# Patient Record
Sex: Female | Born: 1975 | Race: Black or African American | Hispanic: No | Marital: Married | Smoking: Never smoker
Health system: Southern US, Community
[De-identification: ages and names within clinical notes are randomized; demographics above are authoritative.]

## PROBLEM LIST (undated history)

## (undated) DIAGNOSIS — B977 Papillomavirus as the cause of diseases classified elsewhere: Secondary | ICD-10-CM

## (undated) DIAGNOSIS — I10 Essential (primary) hypertension: Secondary | ICD-10-CM

## (undated) DIAGNOSIS — E282 Polycystic ovarian syndrome: Secondary | ICD-10-CM

## (undated) DIAGNOSIS — L680 Hirsutism: Secondary | ICD-10-CM

## (undated) DIAGNOSIS — G43909 Migraine, unspecified, not intractable, without status migrainosus: Secondary | ICD-10-CM

---

## 2001-12-18 ENCOUNTER — Emergency Department (HOSPITAL_COMMUNITY): Admission: EM | Admit: 2001-12-18 | Discharge: 2001-12-18 | Payer: Self-pay | Admitting: *Deleted

## 2001-12-18 ENCOUNTER — Encounter: Payer: Self-pay | Admitting: *Deleted

## 2002-11-18 ENCOUNTER — Other Ambulatory Visit: Admission: RE | Admit: 2002-11-18 | Discharge: 2002-11-18 | Payer: Self-pay | Admitting: Obstetrics and Gynecology

## 2003-03-06 ENCOUNTER — Other Ambulatory Visit: Admission: RE | Admit: 2003-03-06 | Discharge: 2003-03-06 | Payer: Self-pay | Admitting: Obstetrics and Gynecology

## 2003-07-28 ENCOUNTER — Encounter: Admission: RE | Admit: 2003-07-28 | Discharge: 2003-10-26 | Payer: Self-pay | Admitting: Obstetrics and Gynecology

## 2003-08-12 ENCOUNTER — Inpatient Hospital Stay (HOSPITAL_COMMUNITY): Admission: AD | Admit: 2003-08-12 | Discharge: 2003-09-13 | Payer: Self-pay | Admitting: Obstetrics & Gynecology

## 2003-09-10 ENCOUNTER — Encounter (INDEPENDENT_AMBULATORY_CARE_PROVIDER_SITE_OTHER): Payer: Self-pay | Admitting: *Deleted

## 2003-09-14 ENCOUNTER — Encounter: Admission: RE | Admit: 2003-09-14 | Discharge: 2003-10-14 | Payer: Self-pay | Admitting: Obstetrics and Gynecology

## 2004-05-07 ENCOUNTER — Emergency Department (HOSPITAL_COMMUNITY): Admission: EM | Admit: 2004-05-07 | Discharge: 2004-05-08 | Payer: Self-pay | Admitting: Emergency Medicine

## 2005-05-31 IMAGING — US US FETAL BPP W/O NONSTRESS
1 series · 13 of 15 positions shown · non-contrast
Comparison: none

CLINICAL DATA: 27-year-old G1 P0 with decelerations on monitoring.

[Series 1: unknown · 0.30mm/px · 13 of 15 slices shown]
[im 1/15]
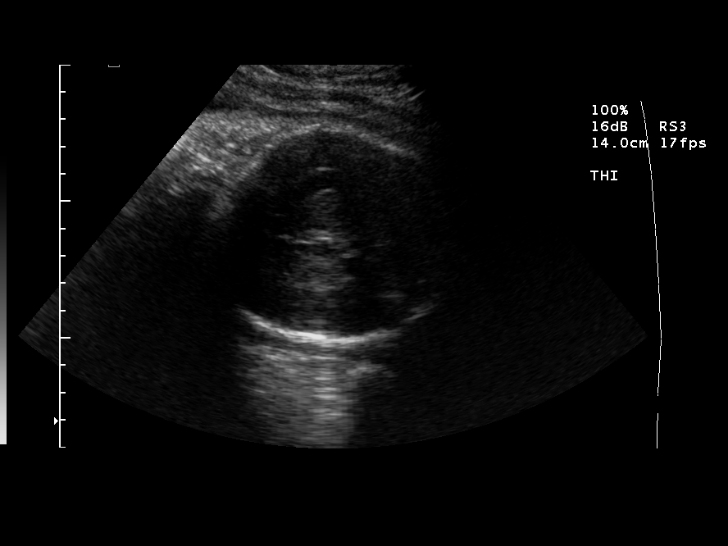
[im 2/15]
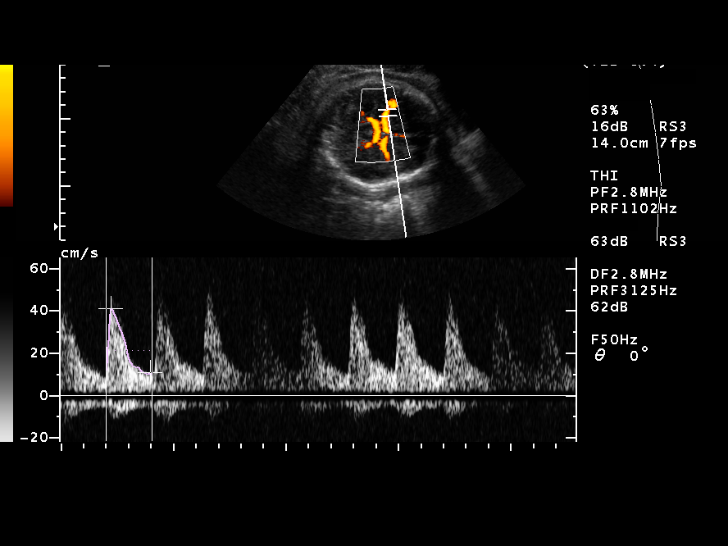
[im 3/15]
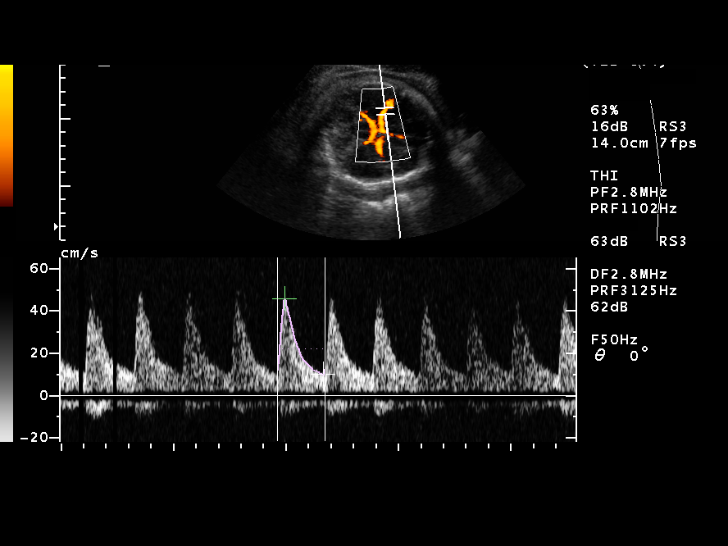
[im 5/15]
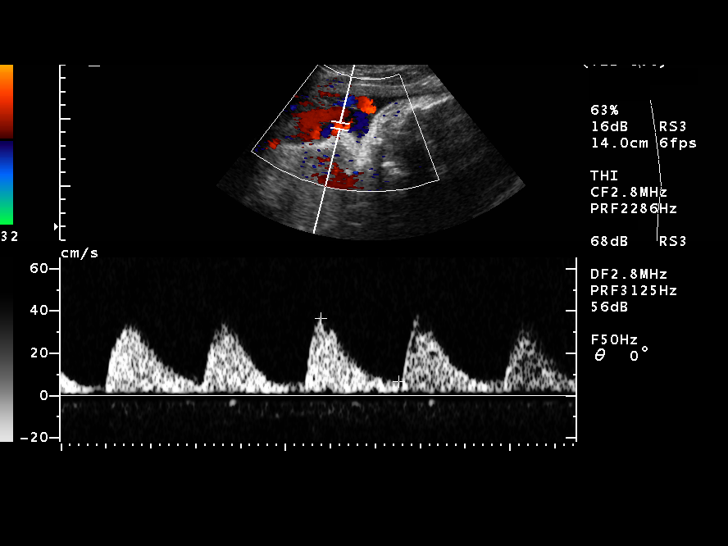
[im 6/15]
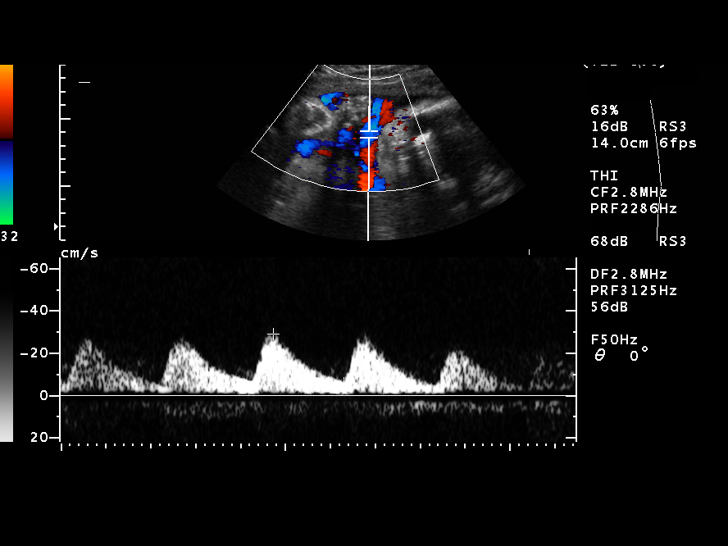
[im 7/15]
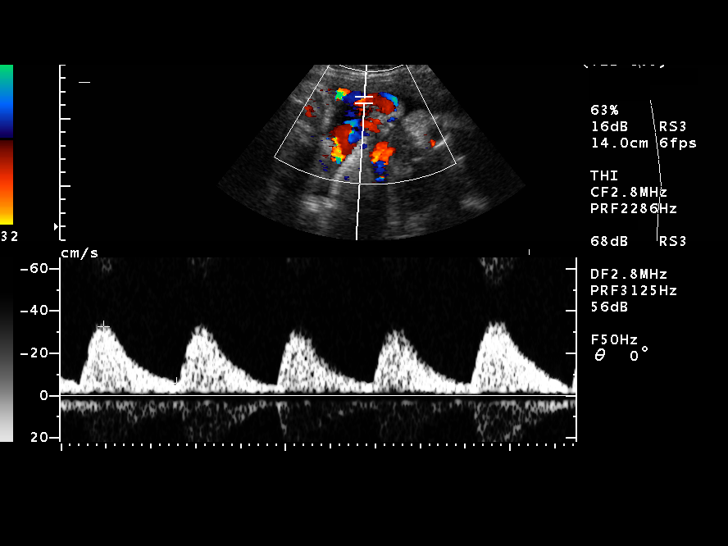
[im 8/15]
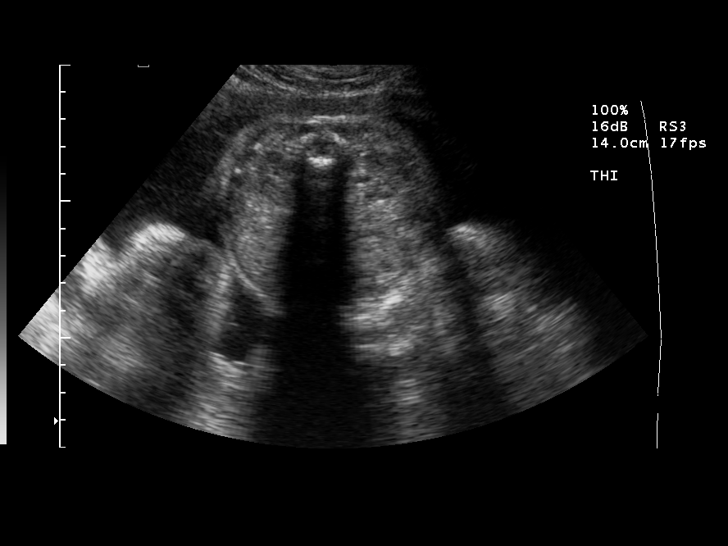
[im 9/15]
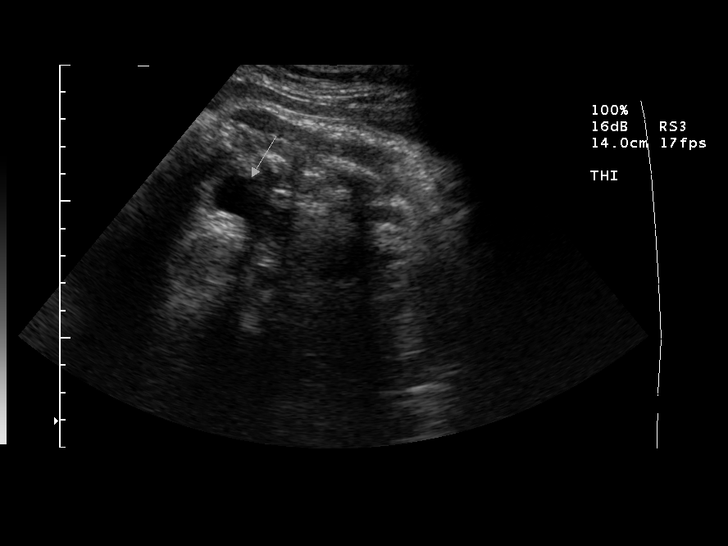
[im 10/15]
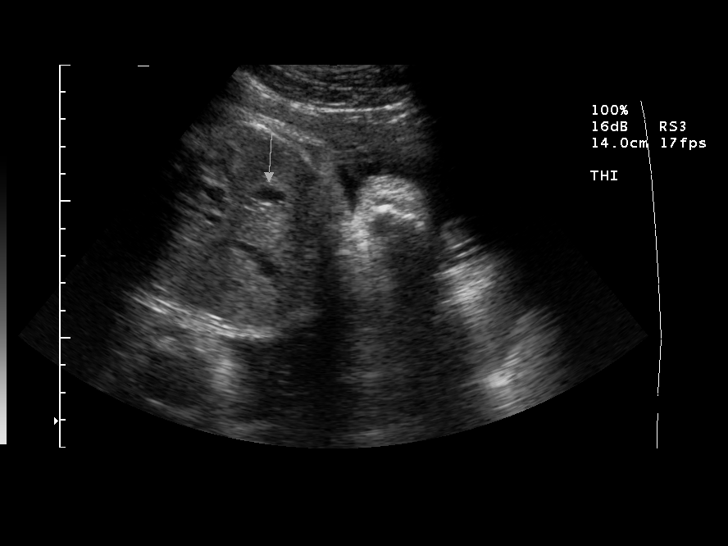
[im 11/15]
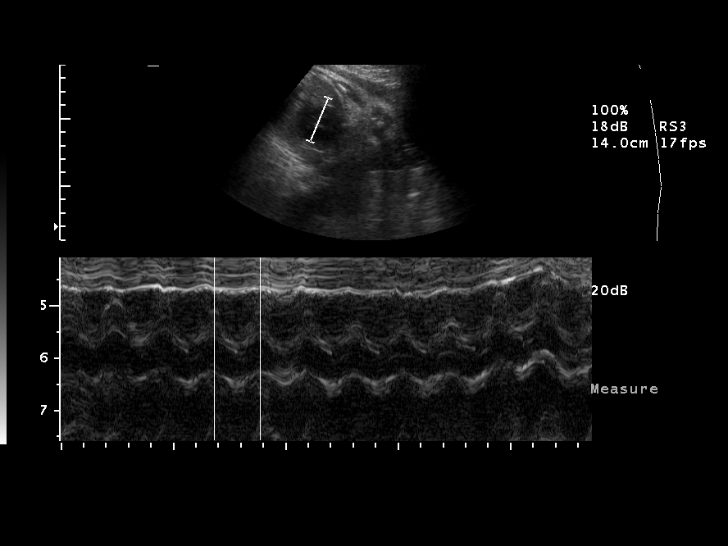
[im 13/15]
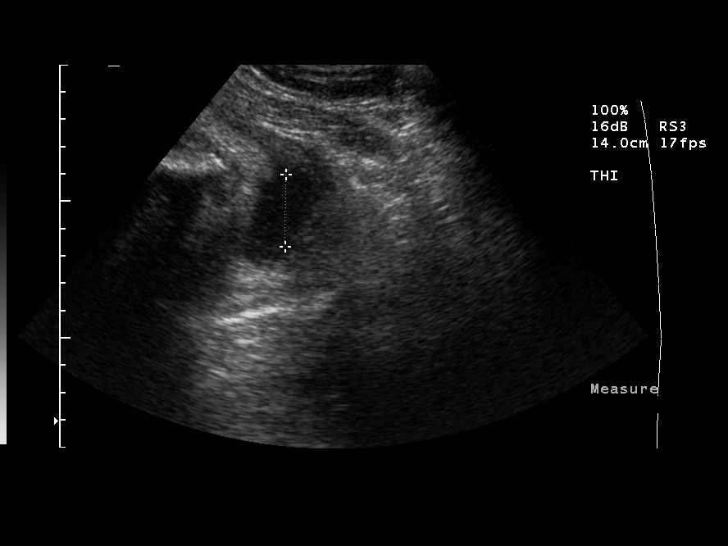
[im 14/15]
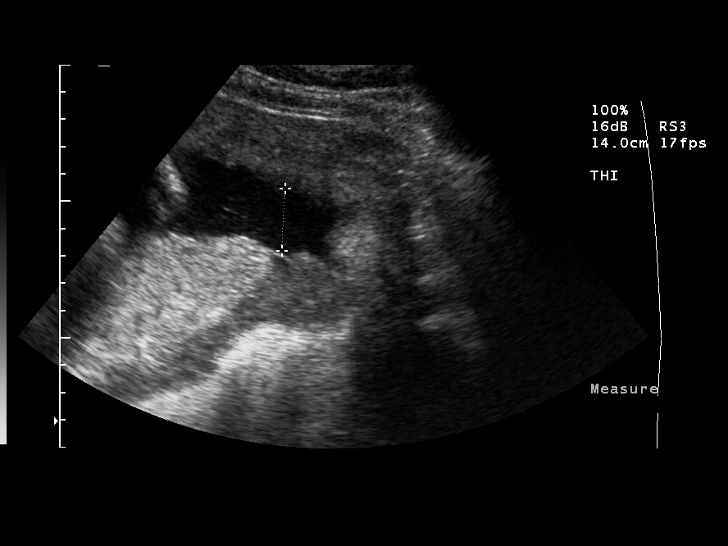
[im 15/15]
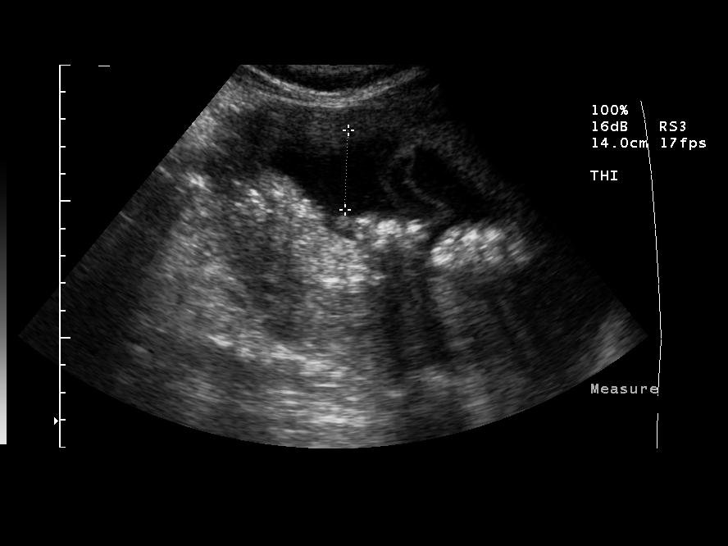

[13 of 15 positions shown; findings below may reference images not displayed]

BIOPHYSICAL PROFILE

 Number of Fetuses:  1
 Heart rate:  147 BPM
 Movement:  Yes
 Breathing:  Yes
 Presentation:  Cephalic
 Placental Location:  Fundal, posterior
 Grade:  I
 Previa:  No
 Amniotic Fluid (Subjective):  Normal
 Amniotic Fluid (Objective):  AFI 10.5 cm (2th-02th %ile =  8.3 to 24.5 cm for 33 weeks) 

 Fetal measurements and complete anatomic evaluation were not requested.  The following fetal anatomy was visualized on this exam:  stomach, kidneys, and bladder.
 By assigned gestational age, patient is 33 weeks 3 days.

 BPP SCORING
 Movements:  2  Time: 20 minutes
 Breathing:  2
 Tone:  2
 Amniotic Fluid:  2
 Total Score:  8

 DOPPLER ULTRASOUND OF FETUS

 Umbilical Artery S/D Ratio:  5.36 (NL< 3.70)

 Middle Cerebral Artery PI:  1.47 (NL> 1.46)
 MATERNAL FINDINGS:
 Cervix:  Not evaluated; on 08/28/03, measured 3.9 cm translabially.
IMPRESSION: Single living intrauterine fetus in cephalic presentation.  Biophysical profile is 8 of 8. the umbilical artery S/D ratio has increased since prior study on [DATE] and continues to be abnormal.  The middle cerebral artery pulsatility index is at the lower range of normal on today?s study.

## 2005-06-04 IMAGING — US US FETAL BPP W/O NONSTRESS
1 series · 18 of 28 positions shown · non-contrast
Comparison: none

CLINICAL DATA: G1 P0.  IUGR.  Assess fetal well-being and obstetrical doppler.

[Series 1: us fetal bpp w/o nonstress · non-contrast · 18 of 32 slices shown]
[im 1/32]
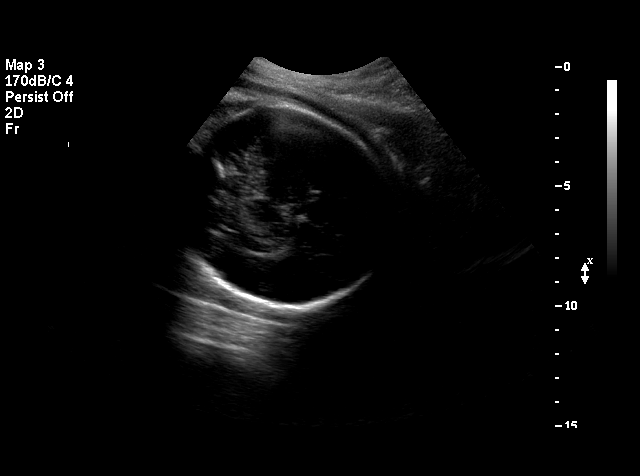
[im 3/32]
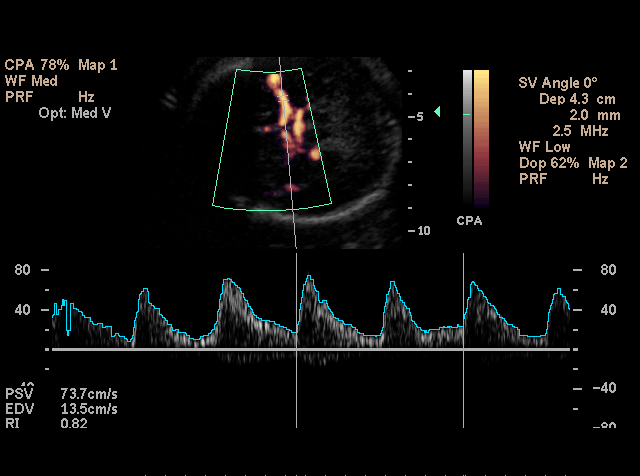
[im 4/32]
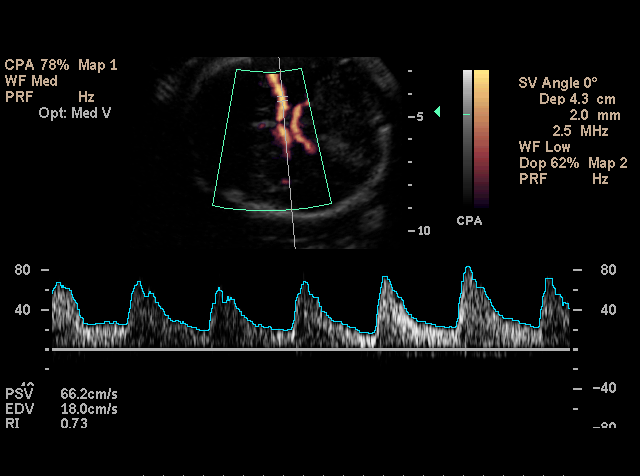
[im 6/32]
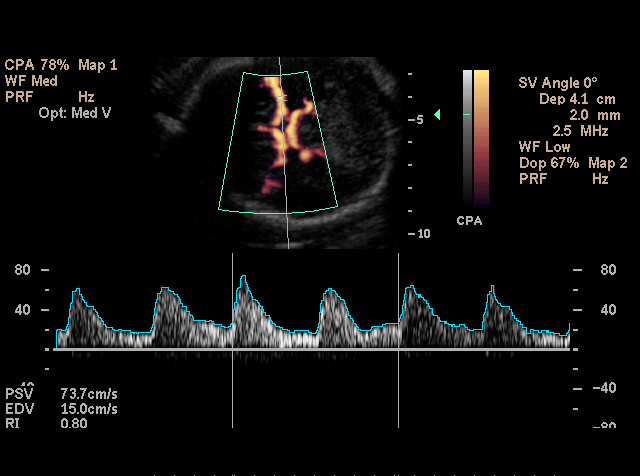
[im 9/32]
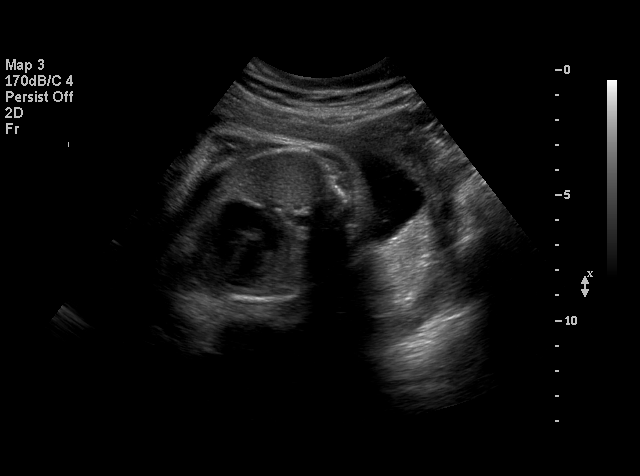
[im 10/32]
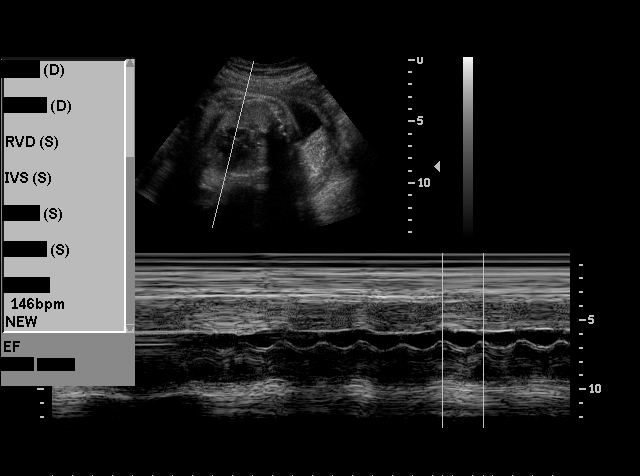
[im 12/32]
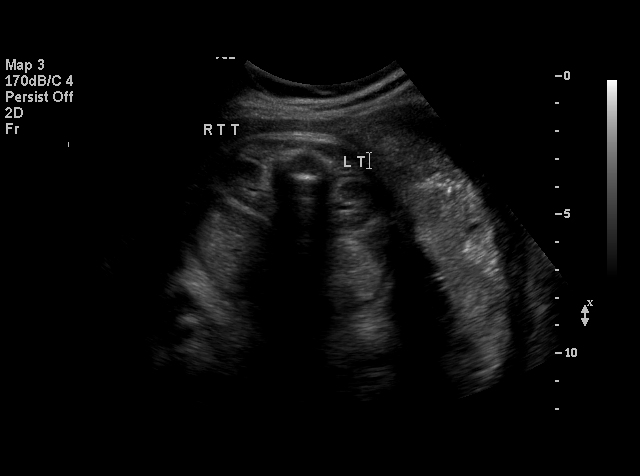
[im 13/32]
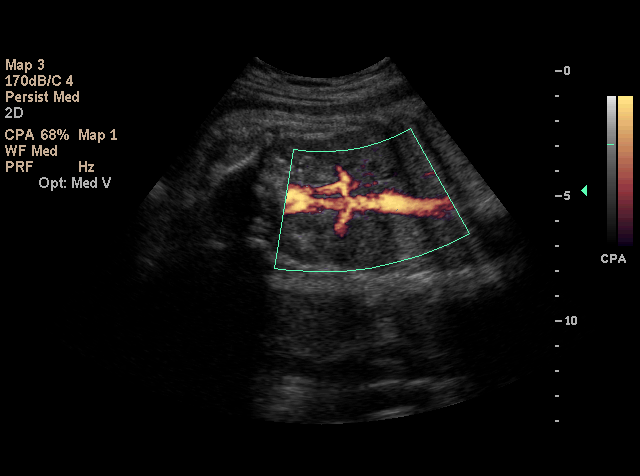
[im 15/32]
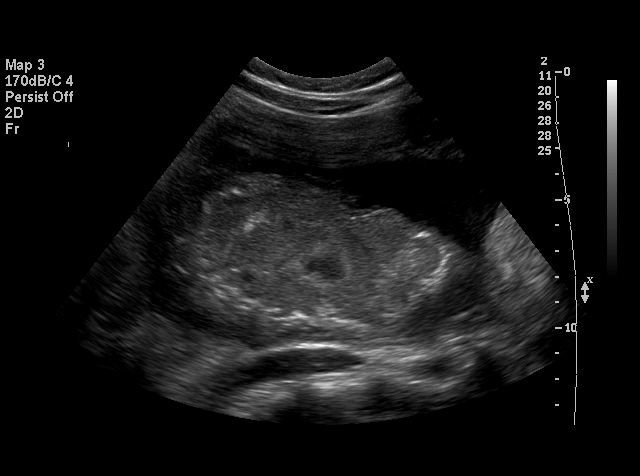
[im 17/32]
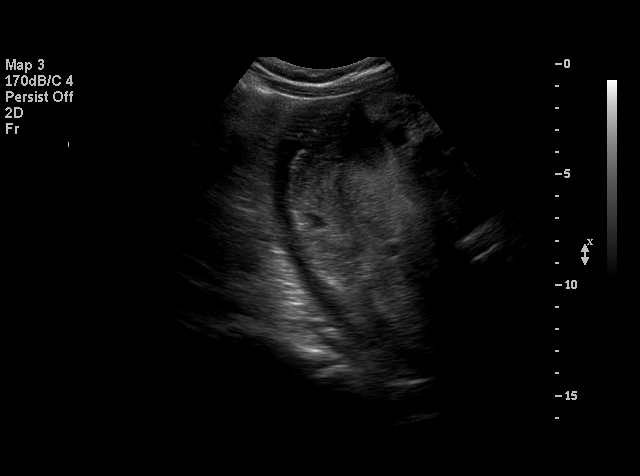
[im 19/32]
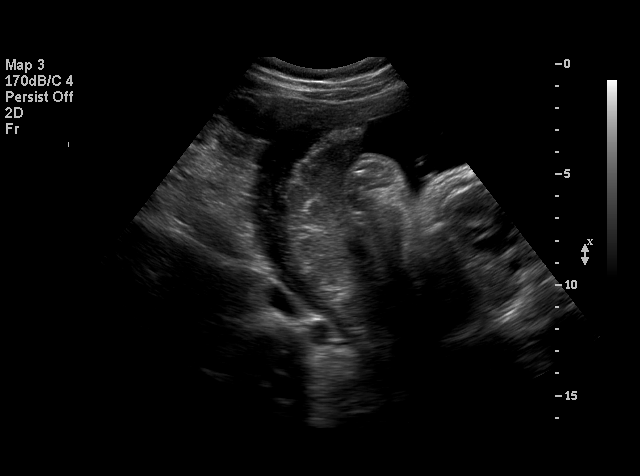
[im 20/32]
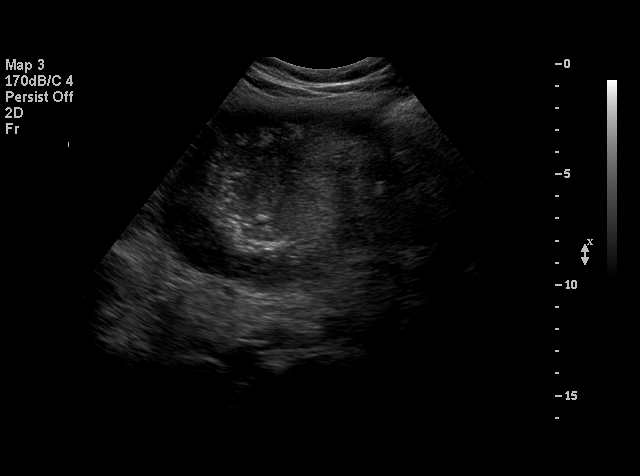
[im 22/32]
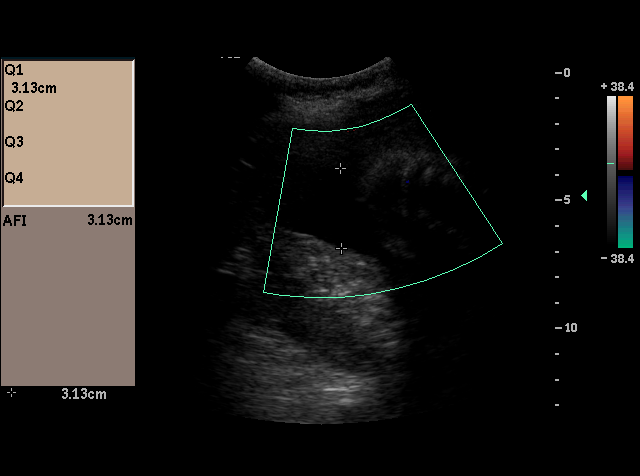
[im 25/32]
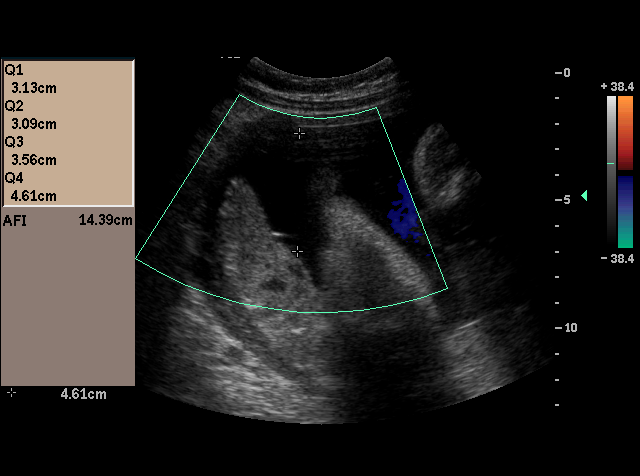
[im 26/32]
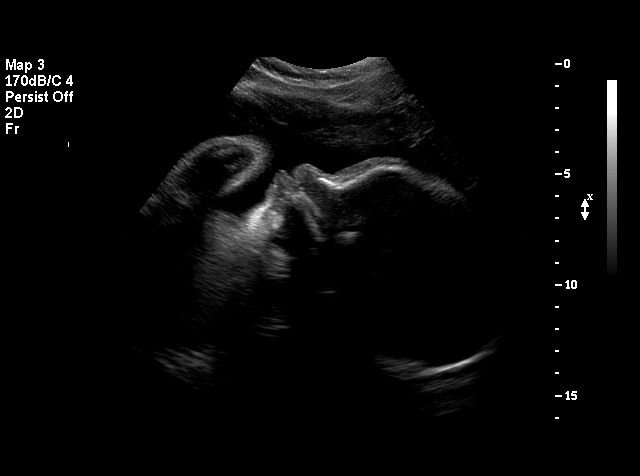
[im 28/32]
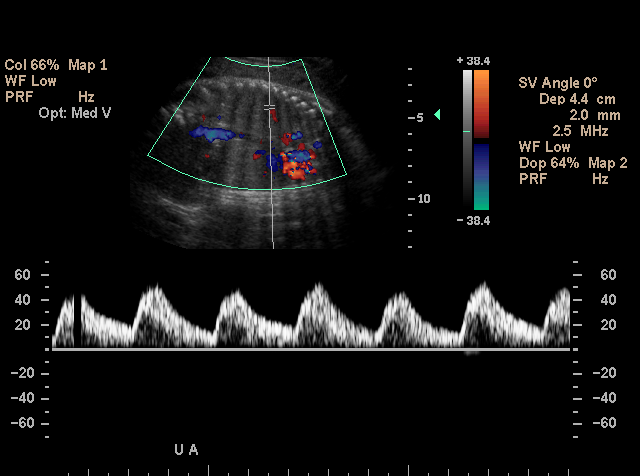
[im 29/32]
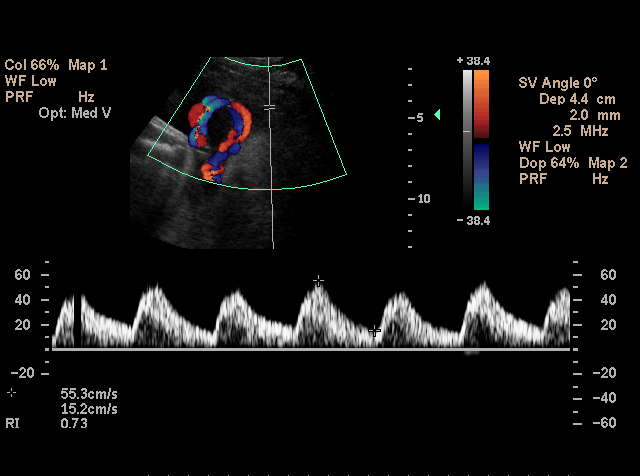
[im 32/32]
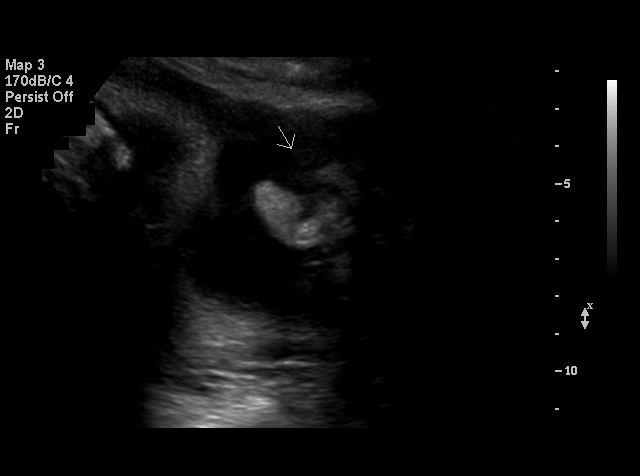

[18 of 28 positions shown; findings below may reference images not displayed]

BIOPHYSICAL PROFILE

 Number of Fetuses:  1
 Heart rate:  146
 Movement:  Yes
 Breathing:  Yes
 Presentation:  Cephalic 
 Placental Location:  Fundal, posterior, and right lateral
 Grade:  II
 Previa:  No
 Amniotic Fluid (Subjective):  Normal
 Amniotic Fluid (Objective):  14.4 cm AFI (5th -95th%ile =   8.1 ? 24.8 cm for 34 wks)

 Fetal measurements and complete anatomic evaluation were not requested.  The following fetal anatomy was visualized on this exam:  Lateral ventricles, thalami, stomach, kidneys, bladder and diaphragm

 BPP SCORING
 Movements:  2  Time:  15 minutes
 Breathing:  2
 Tone:  2
 Amniotic Fluid:  2
 Total Score:  8

 MATERNAL FINDINGS:
 Cervix:  Not evaluated
IMPRESSION: Single living intrauterine fetus in cephalic presentation with biophysical profile score [DATE] in 15 minutes of scanning.
 Normal amniotic fluid volume with AFI 14.4 cm.
 DOPPLER ULTRASOUND OF FETUS

 Umbilical Artery S/D Ratio: 3.73    (NL< 3.57)

 Middle Cerebral Artery PI: 1.57    (NL> 1.35)
IMPRESSION: Slightly elevated umbilical artery S/D ratio with no absent or reversed end diastolic flow.
 Normal middle cerebral pulsatility index.

## 2005-06-05 IMAGING — US US FETAL BPP W/O NONSTRESS
1 series · 13 of 20 positions shown · non-contrast
Comparison: none

CLINICAL DATA: Intrauterine growth restriction.  Evaluate fetal well-being and fetal dopplers.

[Series 1: unknown · 0.32mm/px · 13 of 20 slices shown]
[im 1/20]
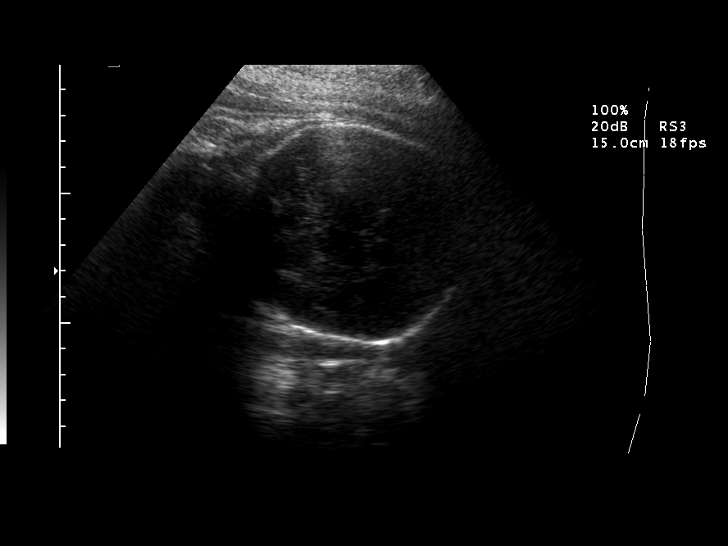
[im 3/20]
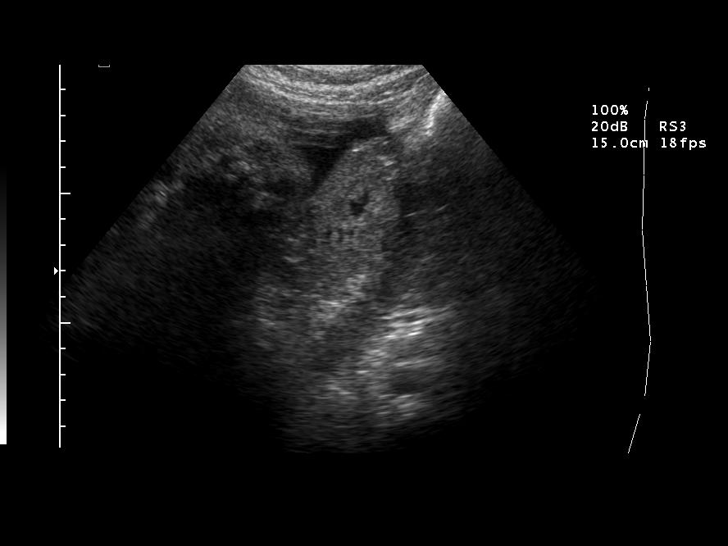
[im 4/20]
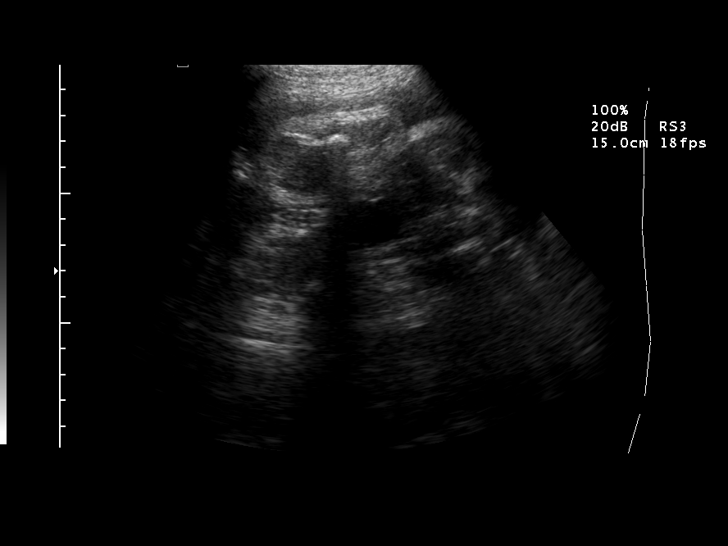
[im 6/20]
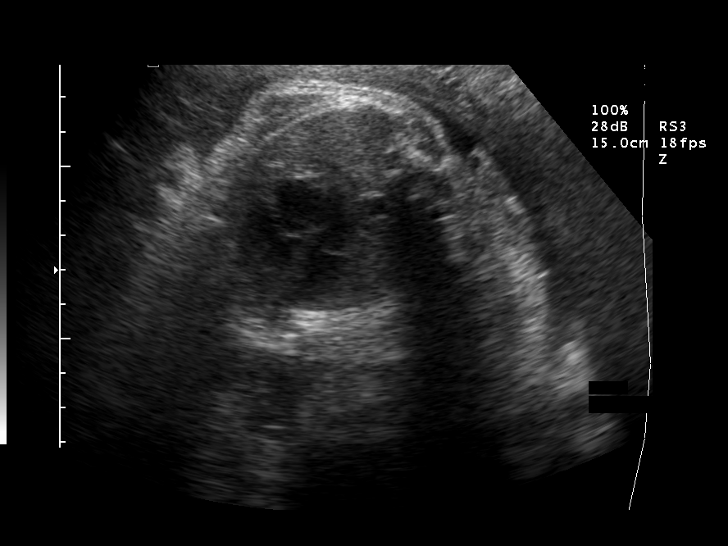
[im 7/20]
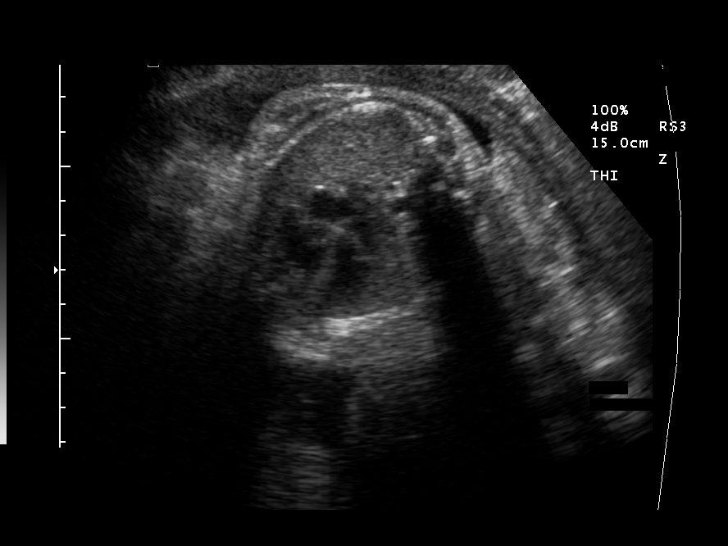
[im 9/20]
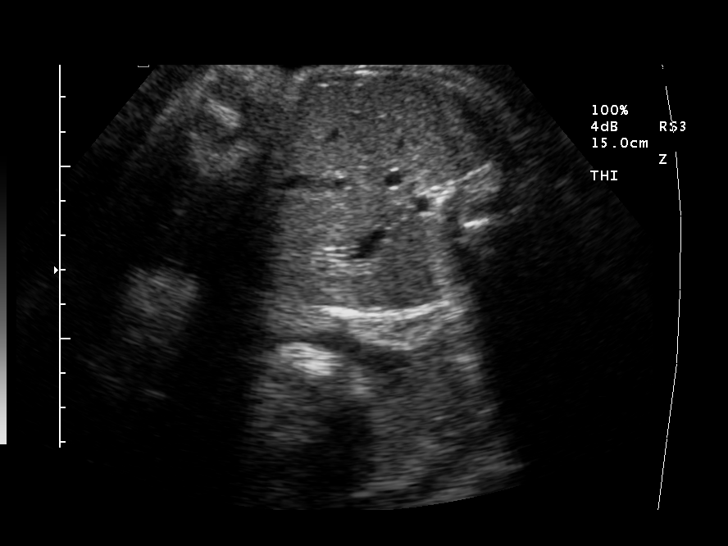
[im 11/20]
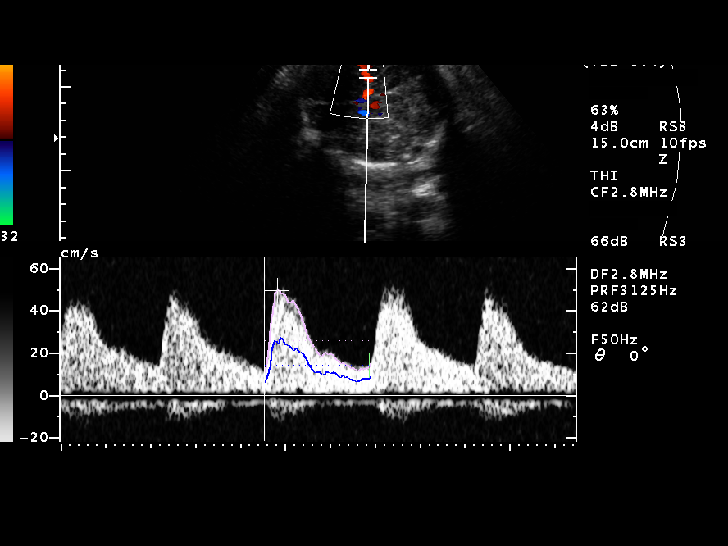
[im 12/20]
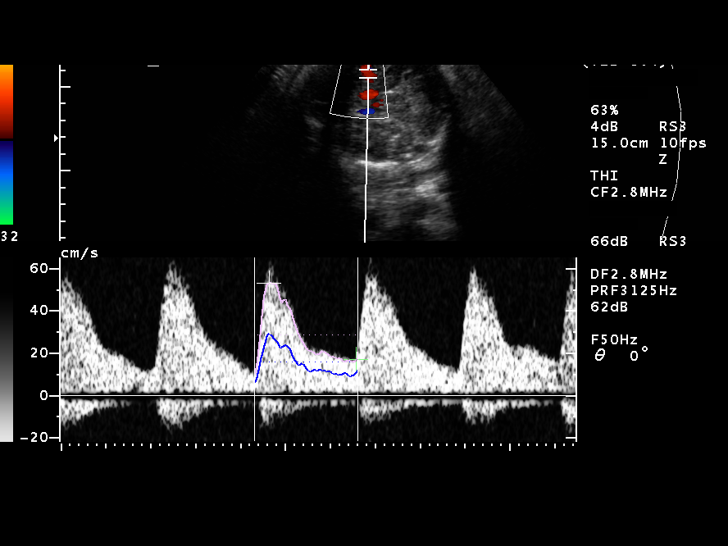
[im 14/20]
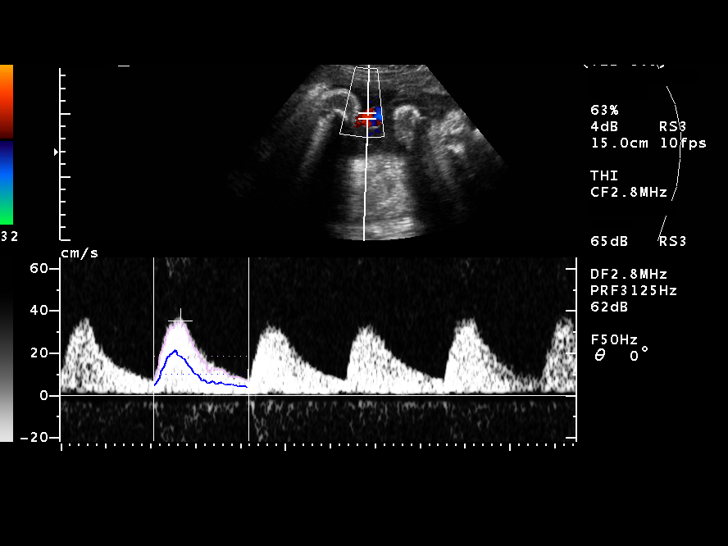
[im 15/20]
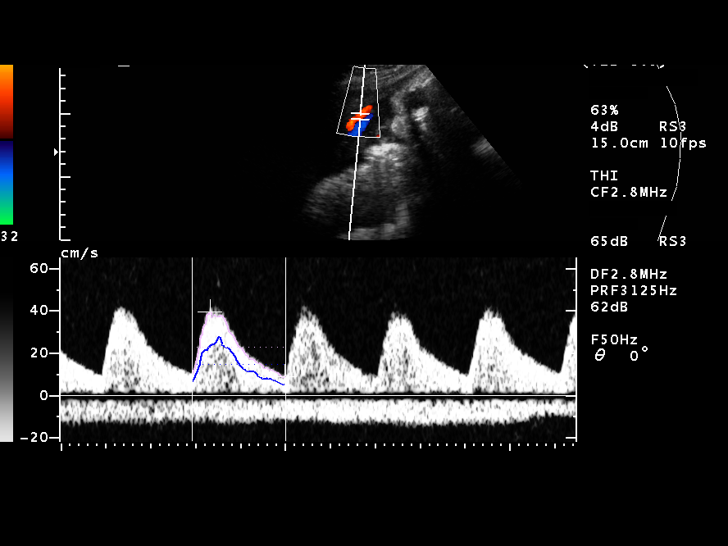
[im 17/20]
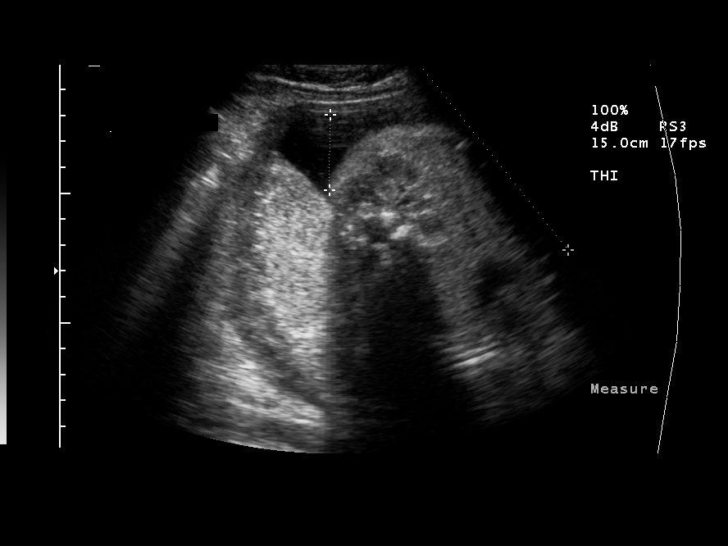
[im 18/20]
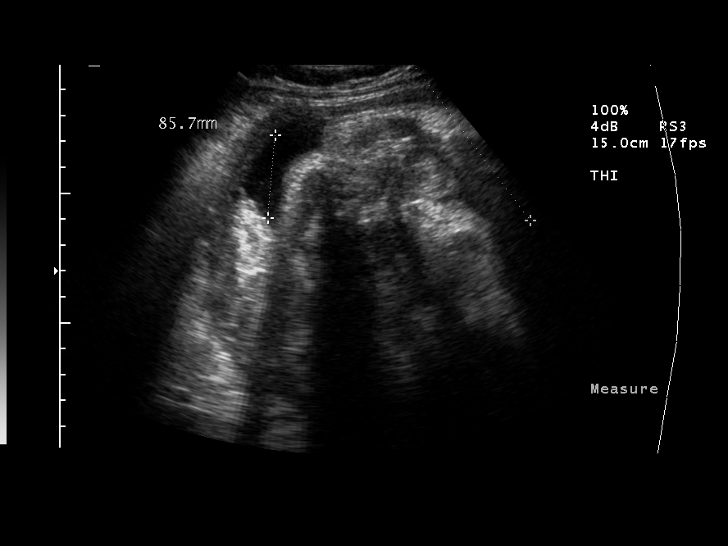
[im 20/20]
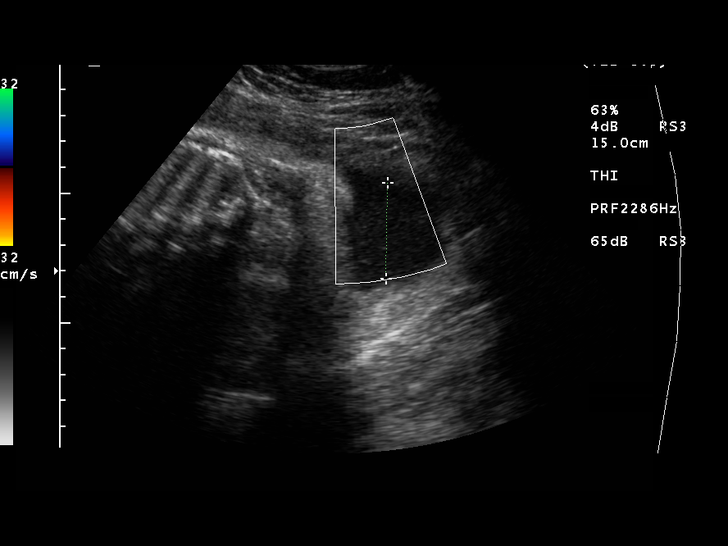

[13 of 20 positions shown; findings below may reference images not displayed]

BIOPHYSICAL PROFILE

Number of Fetuses:  1
Heart rate:  147
Movement:  Yes
Breathing:  Yes
Presentation:  Cephalic 
Placental Location:  Fundal
Grade:  II
Previa:  No
Amniotic Fluid (Subjective):  Normal
Amniotic Fluid (Objective):  11.6 cm AFI (5th -95th%ile = 8.1 ? 24.8 cm for 34 wks)

Fetal measurements and complete anatomic evaluation were not requested.  The following fetal anatomy was visualized on this exam:  Lateral ventricles, four chamber heart, stomach, kidneys, and bladder.

BPP SCORING
Movements:  2  Time:  10 minutes
Breathing:  2
Tone:  2
Amniotic Fluid:  2
Total Score:  8

MATERNAL FINDINGS:
Cervix:  Not evaluated
DOPPLER ULTRASOUND OF FETUS

Umbilical Artery S/D Ratio: 4.82   (NL< 3.57)

Middle Cerebral Artery PI: 1.35    (NL> 1.35)
IMPRESSION: Biophysical profile score 8 of 8.
Subjectively and quantitatively normal amniotic fluid volume.
Elevated umbilical artery doppler with an umbilical artery systolic-to-diastolic ratio today measuring 4.82 with a normal of less than 3.57 expected at 34 weeks estimated gestational age.  Middle cerebral artery doppler assessment is at the borderline measuring 1.35 with a normal of greater than 1.35 expected.  No absence or reversal of end diastolic flow is noted on any of the interrogated strips.  
The cervix was not evaluated today.

## 2005-06-07 IMAGING — US US FETAL BPP W/O NONSTRESS
1 series · 13 of 28 positions shown · non-contrast
Comparison: none

CLINICAL DATA: IUGR.  G1 P0.  Assess fetal well-being and obstetrical doppler.

[Series 1: unknown · 0.30mm/px · 13 of 30 slices shown]
[im 2/30]
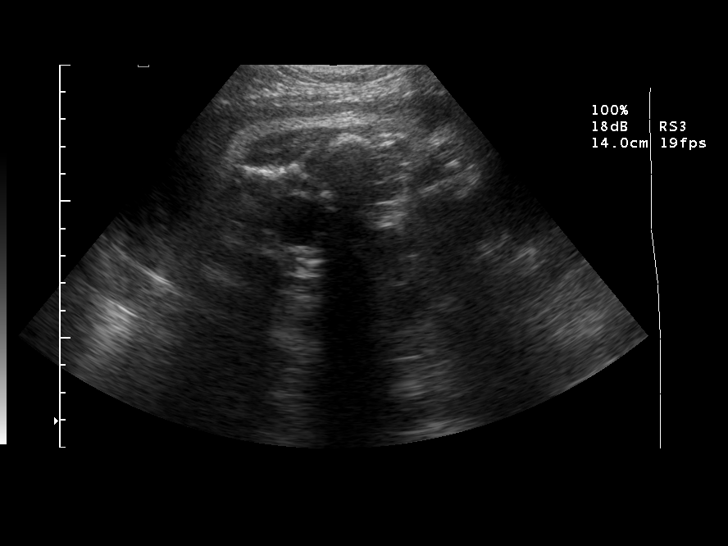
[im 4/30]
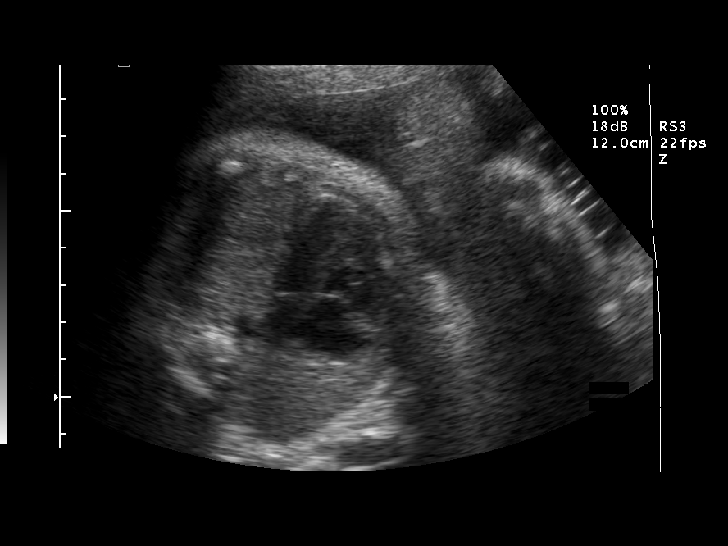
[im 6/30]
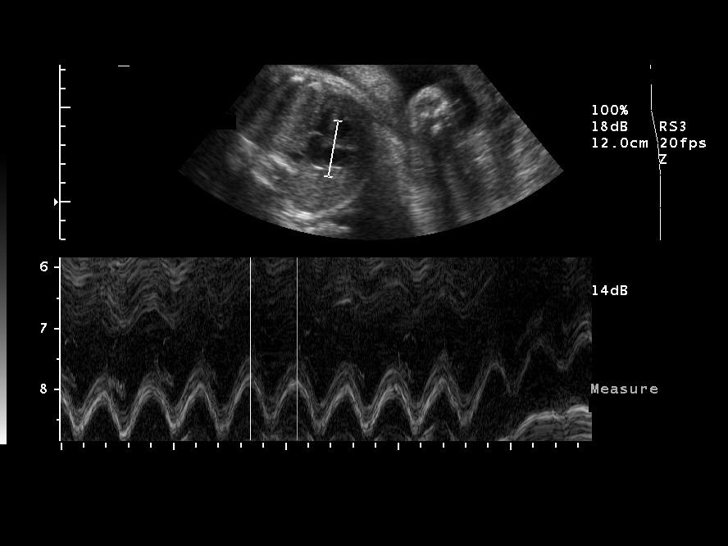
[im 8/30]
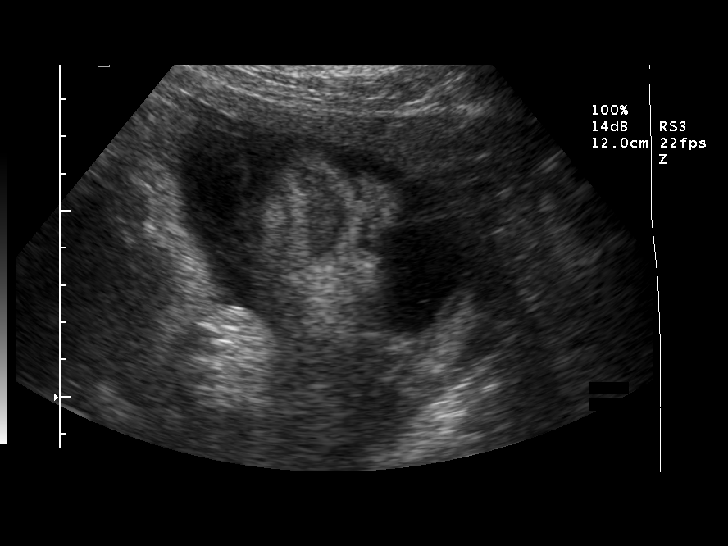
[im 10/30]
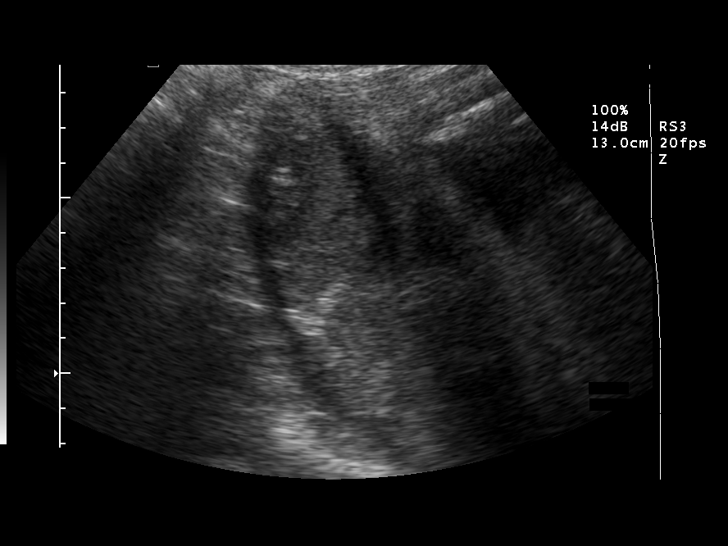
[im 12/30]
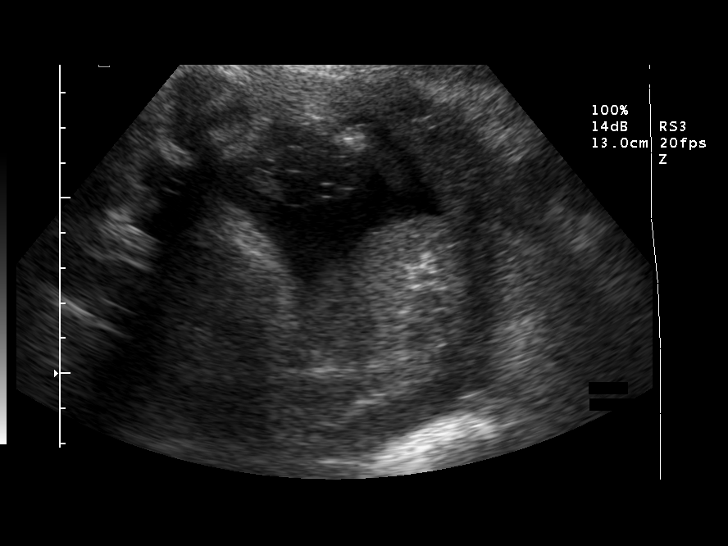
[im 16/30]
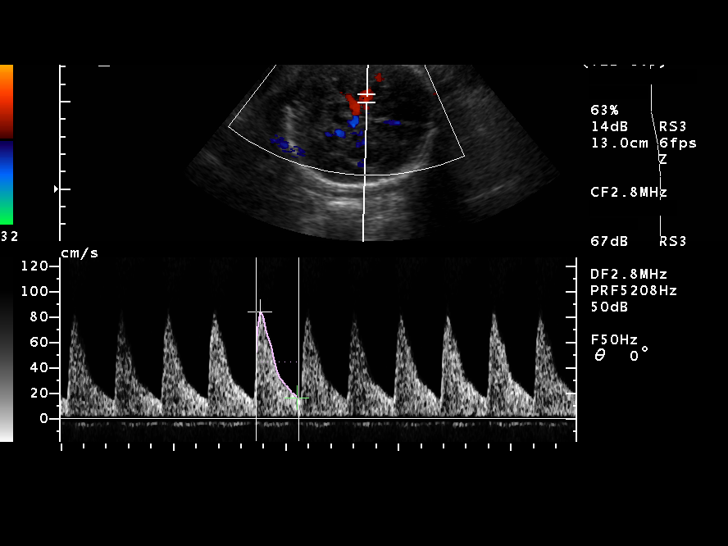
[im 18/30]
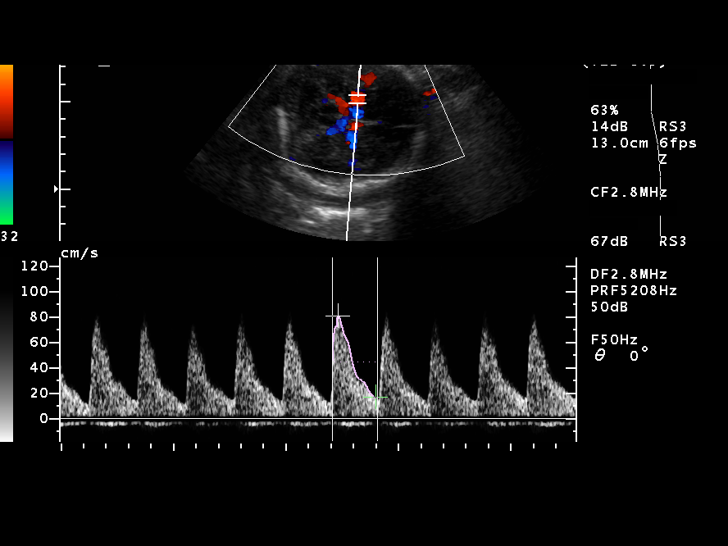
[im 20/30]
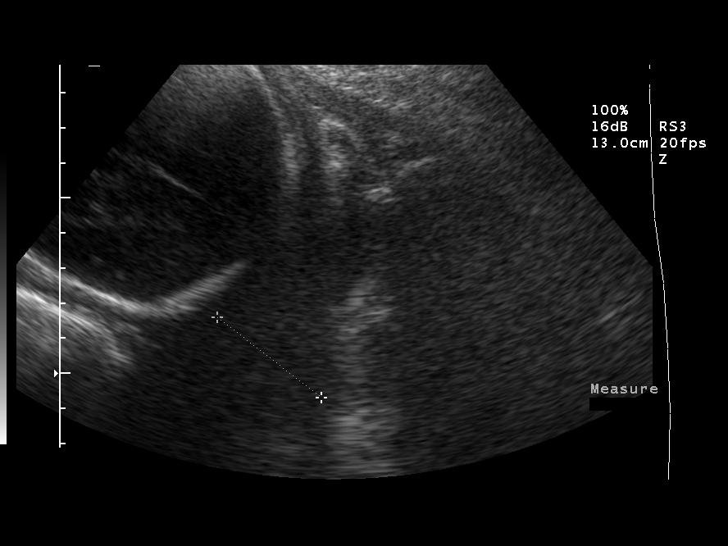
[im 22/30]
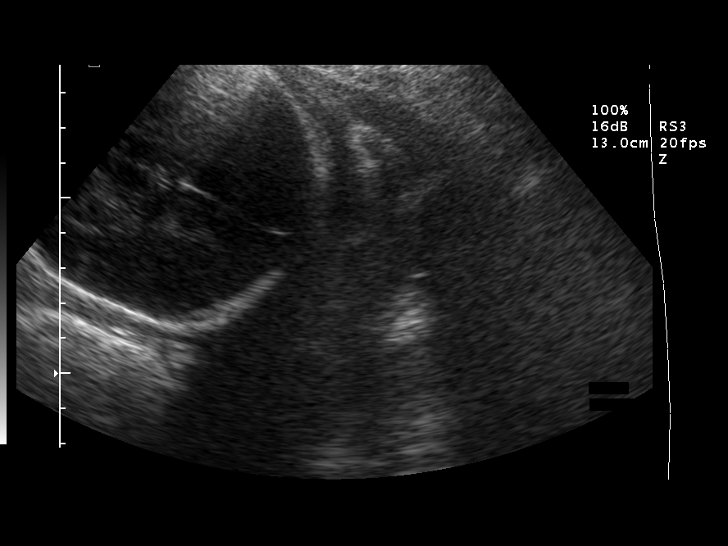
[im 24/30]
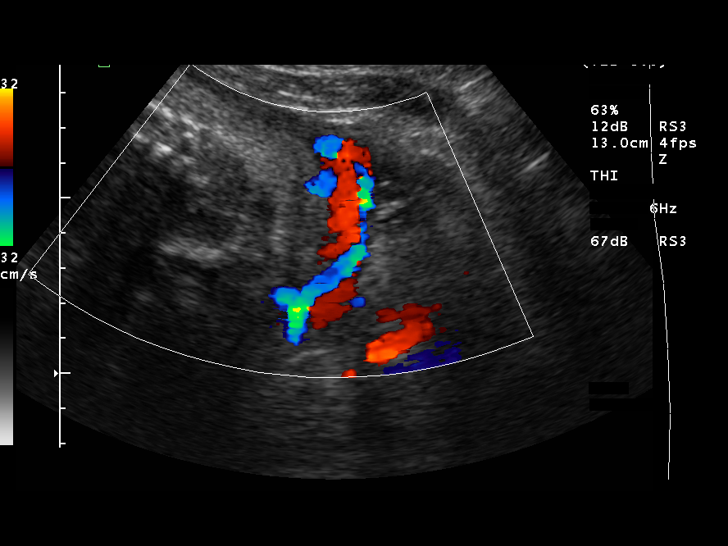
[im 26/30]
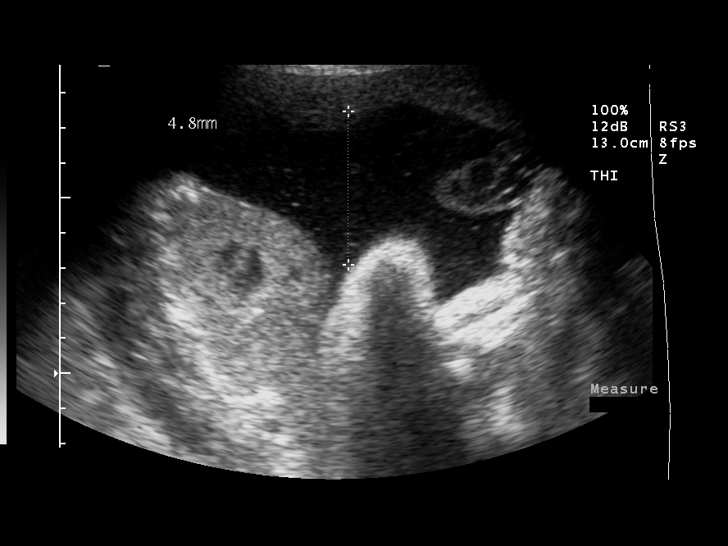
[im 28/30]
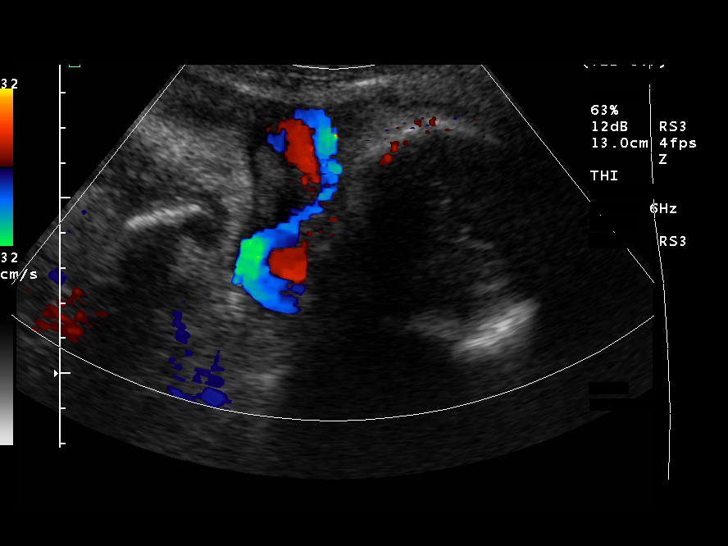

[13 of 28 positions shown; findings below may reference images not displayed]

BIOPHYSICAL PROFILE
 Comparison 09/04/03
 Number of Fetuses:  1
 Heart rate:  145
 Movement:  Yes
 Breathing:  Yes
 Presentation:  Cephalic
 Placental Location:  Fundal, posterior
 Grade:  II
 Previa:  No
 Amniotic Fluid (Subjective):  Normal
 Amniotic Fluid (Objective):  16 cm AFI (5th -95th%ile =   8.1 ? 24.9 cm for 34 wks)

 Fetal measurements and complete anatomic evaluation were not requested.  The following fetal anatomy was visualized on this exam:  Lateral ventricles, four chamber heart, stomach, kidneys, bladder, and upper lip.

 BPP SCORING
 Movements:  2  Time:  5 minutes
 Breathing:  2
 Tone:  2
 Amniotic Fluid:  2  
 Total Score:  8

 MATERNAL FINDINGS:
 Cervix:  3.8 cm Transabdominally
 DOPPLER ULTRASOUND OF FETUS

 Umbilical Artery S/D Ratio: 3.55    (NL< 3.57)

 Middle Cerebral Artery PI: 1.43    (NL> 1.35)
IMPRESSION: Single living intrauterine gestation in cephalic presentation with normal amniotic fluid volume.  
 Fetal anatomy as described.  Suggestion of nuchal cord.  Nursing notified of these results on 09/05/03.
 Biophysical profile score [DATE] after 5 minutes.
 Upper limits of normal umbilical artery S/D ratio with normal MCA pulsatility index.  No evidence of absence or reversal of end diastolic flow.

## 2005-06-11 IMAGING — US US AMNIOCENTESIS
1 series · 14 of 16 positions shown · non-contrast
Comparison: none

[Series 1: unknown · 0.30mm/px · 14 of 30 slices shown]
[im 1/30]
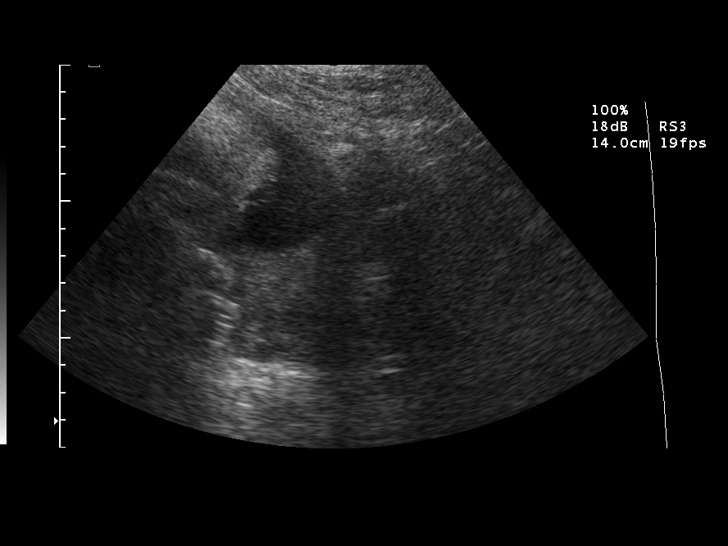
[im 2/30]
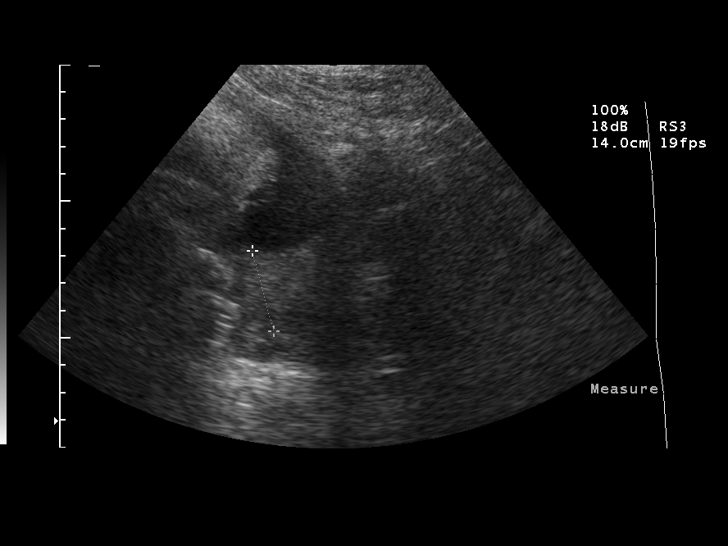
[im 4/30]
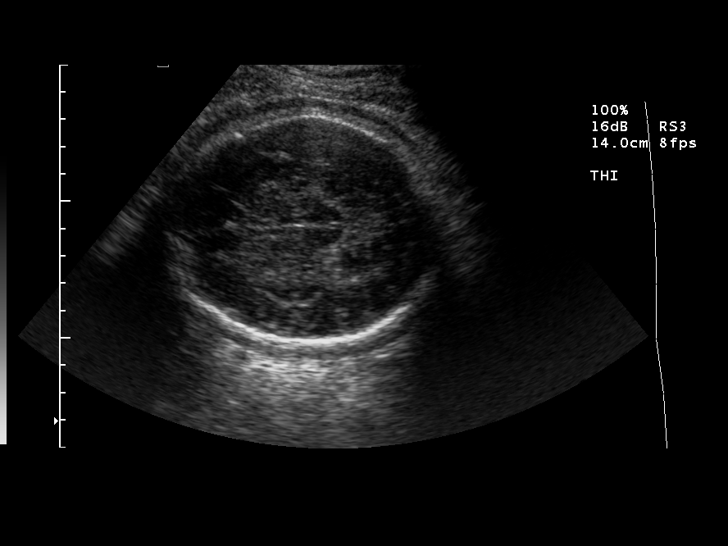
[im 8/30]
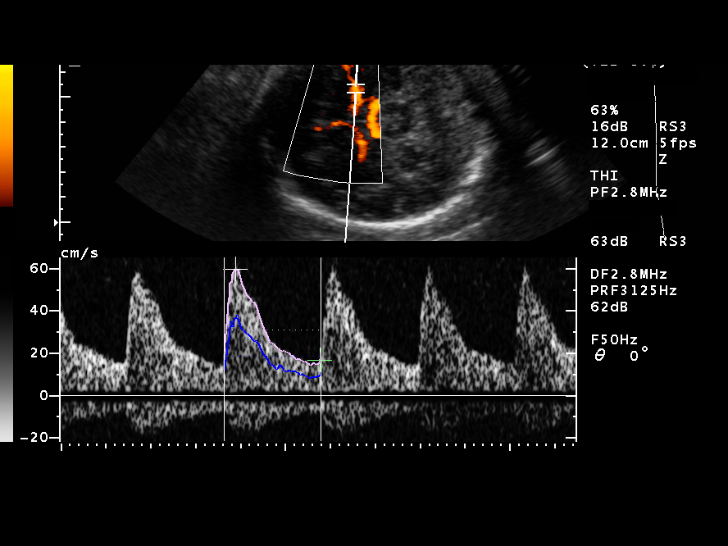
[im 10/30]
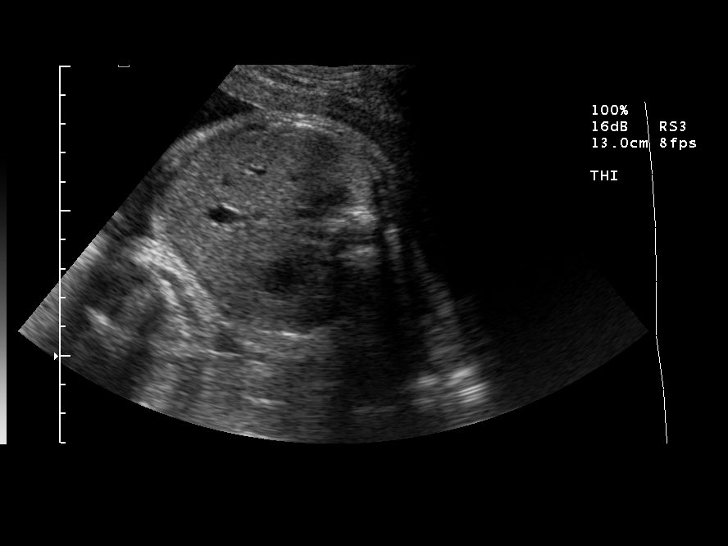
[im 12/30]
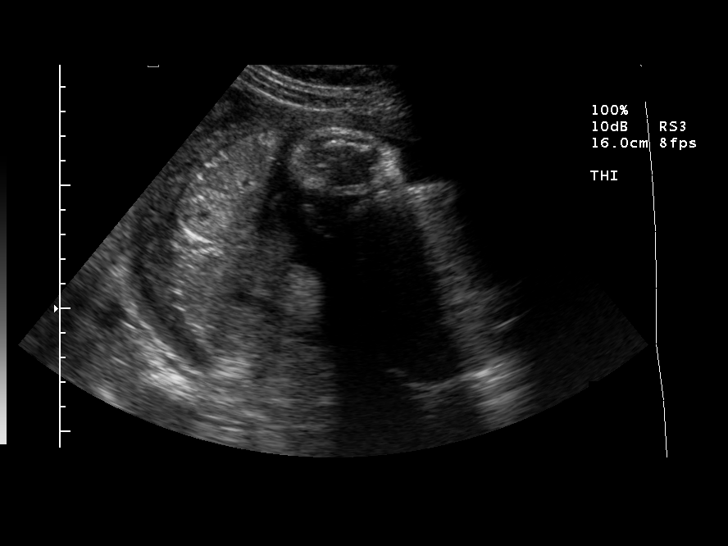
[im 14/30]
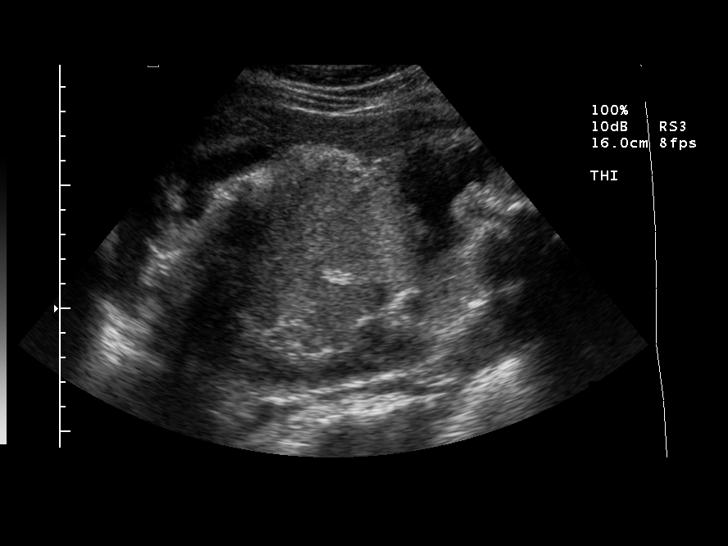
[im 16/30]
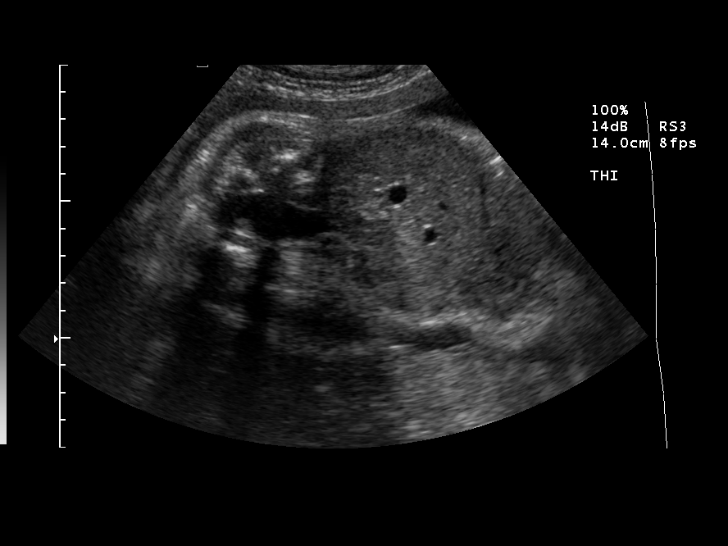
[im 18/30]
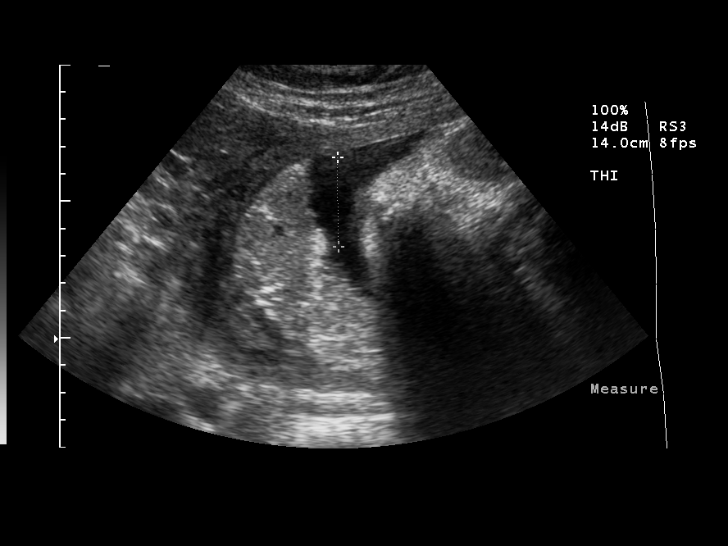
[im 20/30]
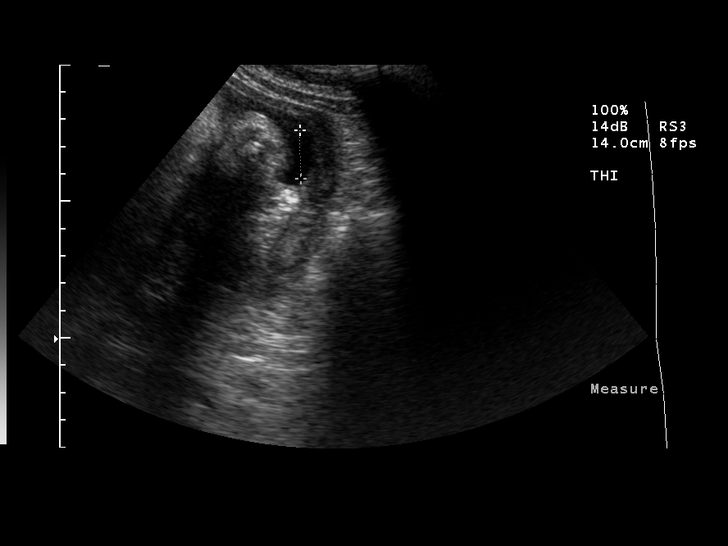
[im 24/30]
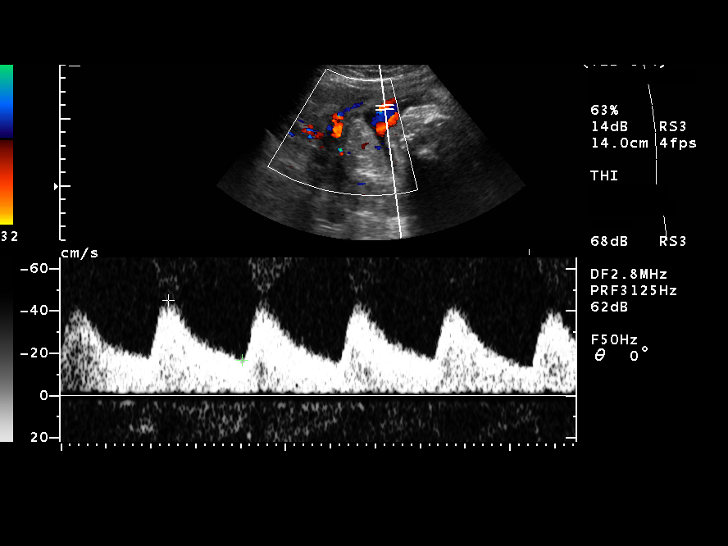
[im 26/30]
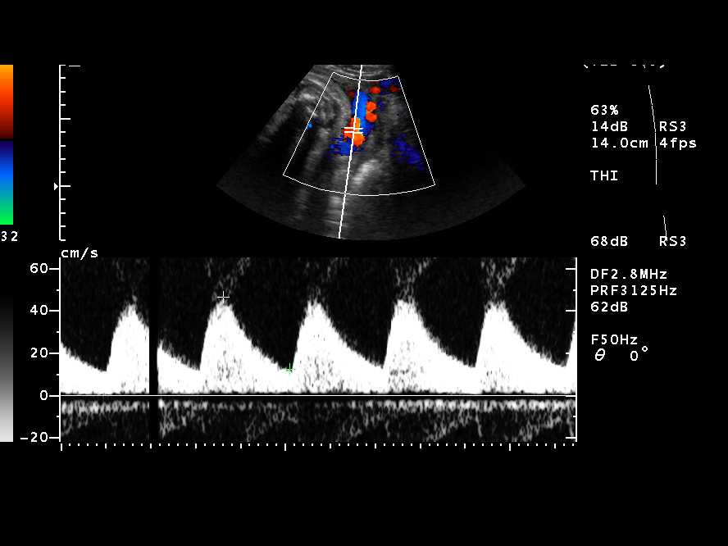
[im 28/30]
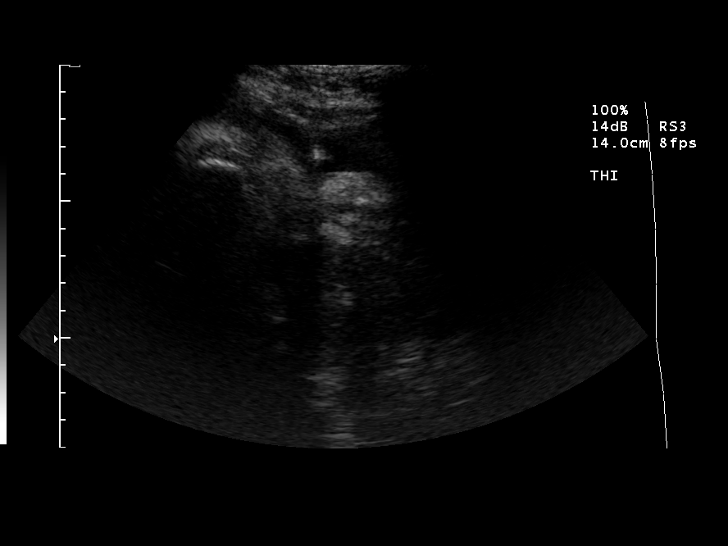
[im 30/30]
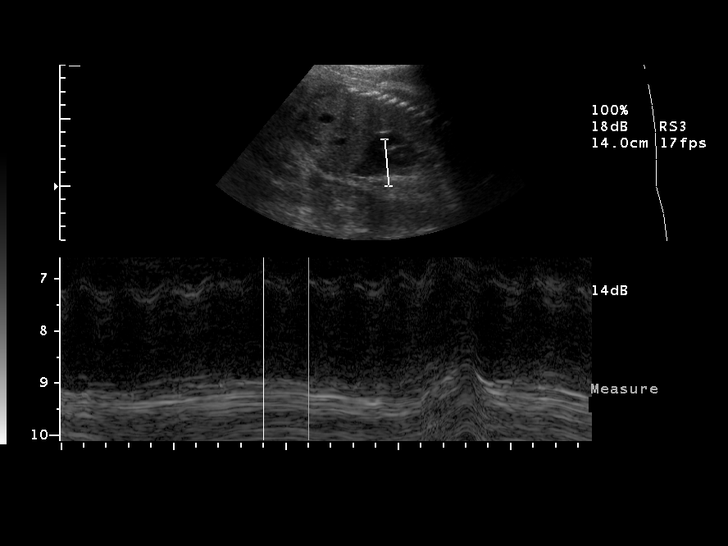

[14 of 16 positions shown; findings below may reference images not displayed]

ULTRASOUND AMNIOCENTESIS
 Ultrasound was utilized to perform amniocentesis by the requesting physician.

## 2007-03-05 ENCOUNTER — Encounter: Admission: RE | Admit: 2007-03-05 | Discharge: 2007-03-06 | Payer: Self-pay | Admitting: Obstetrics and Gynecology

## 2007-06-30 ENCOUNTER — Inpatient Hospital Stay (HOSPITAL_COMMUNITY): Admission: AD | Admit: 2007-06-30 | Discharge: 2007-06-30 | Payer: Self-pay | Admitting: Obstetrics

## 2007-07-16 ENCOUNTER — Encounter (INDEPENDENT_AMBULATORY_CARE_PROVIDER_SITE_OTHER): Payer: Self-pay | Admitting: Obstetrics and Gynecology

## 2007-07-16 ENCOUNTER — Inpatient Hospital Stay (HOSPITAL_COMMUNITY): Admission: RE | Admit: 2007-07-16 | Discharge: 2007-07-16 | Payer: Self-pay | Admitting: Obstetrics and Gynecology

## 2007-07-16 ENCOUNTER — Inpatient Hospital Stay (HOSPITAL_COMMUNITY): Admission: AD | Admit: 2007-07-16 | Discharge: 2007-07-19 | Payer: Self-pay | Admitting: Obstetrics and Gynecology

## 2007-07-22 ENCOUNTER — Inpatient Hospital Stay (HOSPITAL_COMMUNITY): Admission: AD | Admit: 2007-07-22 | Discharge: 2007-07-24 | Payer: Self-pay | Admitting: Obstetrics & Gynecology

## 2007-07-23 ENCOUNTER — Encounter (INDEPENDENT_AMBULATORY_CARE_PROVIDER_SITE_OTHER): Payer: Self-pay | Admitting: Obstetrics & Gynecology

## 2007-07-23 ENCOUNTER — Encounter: Admission: RE | Admit: 2007-07-23 | Discharge: 2007-08-22 | Payer: Self-pay | Admitting: Obstetrics and Gynecology

## 2007-08-23 ENCOUNTER — Encounter: Admission: RE | Admit: 2007-08-23 | Discharge: 2007-08-27 | Payer: Self-pay | Admitting: Obstetrics and Gynecology

## 2007-08-28 ENCOUNTER — Encounter: Admission: RE | Admit: 2007-08-28 | Discharge: 2007-08-28 | Payer: Self-pay | Admitting: *Deleted

## 2007-08-28 ENCOUNTER — Inpatient Hospital Stay (HOSPITAL_COMMUNITY): Admission: AD | Admit: 2007-08-28 | Discharge: 2007-08-30 | Payer: Self-pay | Admitting: *Deleted

## 2008-01-29 ENCOUNTER — Emergency Department (HOSPITAL_COMMUNITY): Admission: EM | Admit: 2008-01-29 | Discharge: 2008-01-29 | Payer: Self-pay | Admitting: Emergency Medicine

## 2010-08-02 NOTE — Op Note (Signed)
Bethany Morris, Bethany Morris               ACCOUNT NO.:  [id number]   MEDICAL RECORD NO.:  [id number]          PATIENT TYPE:  INP   LOCATION:  9103                          FACILITY:  [facility]   PHYSICIAN:  Maxie Better, M.D.DATE OF BIRTH:  February 09, 1976   DATE OF PROCEDURE:  DATE OF DISCHARGE:                               OPERATIVE REPORT   PREOPERATIVE DIAGNOSES:  Previous cesarean section, active labor, class  A1 gestational diabetes, and term gestation.   POSTOPERATIVE DIAGNOSES:  Active labor, previous cesarean section, class  A1 gestational diabetes, and term gestation.   PROCEDURES:  1. Repeat cesarean section, Sharl Ma hysterotomy   ANESTHESIA:  Spinal.   SURGEON:  Maxie Better, MD   ASSISTANT:  Chester Holstein. Earlene Plater, MD   INDICATIONS:  A 35 year old gravida 2, para 0-1-0-1, female at 78 weeks  with previous cesarean section and had prenatal course complicated by  class A1, gestational diabetes who presented in active labor with  request for repeat cesarean section.  Risks and benefits of procedure  have been explained to the patient.  Consent was signed, the patient was  transferred to the operating room.   PROCEDURE:  Under adequate spinal anesthesia, the patient was placed in  the supine position with a left lateral tilt.  She was sterilely prepped  and draped in usual fashion.  The bladder was catheterized with  indwelling Foley catheter for large amount of urine.  A 0.25% Marcaine  was injected along the previous Pfannenstiel skin incision site.  Pfannenstiel skin incision was made, carried onto the rectus fascia.  Rectus fascia was opened transversely.  The rectus fascia was then  bluntly sharply and dissected off the rectus muscle in superior and  inferior fashion.  The scar tissue was then encountered.  Careful  dissection resulted in the parietal peritoneum being seen and opened  bluntly in the superior area to avoid what appears to be the bladder  tucked to the lower  anterior abdominal wall peritoneum.  After that, the  bladder was noted to be adherent partly to the anterior abdominal wall.  Careful dissection resulted in the lower uterine segment, vesicouterine  peritoneum being opened transversely after the bladder adhesions were  released.  The bladder was then displaced inferiorly.  Lower uterine  segment was well developed.  Lower uterine segment opened with a  transverse incision and extended with bandage sutures.  The membranes  was intact, evidence of meconium was noted.  Artificial rupture of  membranes.  Subsequent delivery of the head.  Baby was DeLee'ed on the  abdomen.  Result of the delivery was a live female who was also bulb  suctioned on the abdomen.  Cord was clamped and cut.  The baby was  transferred to the awaiting pediatrician with Apgars 9 and 9 at 1 and 5  minutes.  Placenta was spontaneously intact, sent to pathology.  Uterine  cavity was cleaned of debris.  Uterine incision had no extension.  Uterine incision was closed, the first layer with 0-Monocryl running  locked stitch.  There was a bleeding noted inferiorly, which was  hemostased with 0-Monocryl suture  and oversewn to the superior aspect of  the low uterine segment.  Good hemostasis was then noted.  Decision was  then made to not to do a second layer closure.  Abdomen was then  copiously irrigated, suctioned of debris.  Both fallopian tubes were  normal.  The large polycystic ovaries were noted bilaterally.  The  paracolic gutters were cleaned of debris.  The parietal peritoneum was  closed with 2-0 plain suture over the he inferior aspect of the bladder,  which was still adherent to one area.  It was carefully dissected off  and noted not to bring the bladder high up on the anterior abdominal  wall for future surgeries if was necessary.  The undersurface of the  rectus fascia was inspected.  Small bleeders cauterized.  The rectus  fascia was closed with 0-Vicryl x2.   The subcutaneous area was  irrigated, small bleeders cauterized.  Interrupted 2-0 plain suture was  used to approximate the bed space.  Skin was approximated using Ethicon  staples.   SPECIMENS:  Placenta sent to pathology.  Estimated blood loss was 700  mL.  Intraoperative fluid was 2900 mL of crystalloid.  Urine output was  500 mL clear yellow urine.  Sponge and instrument counts x2 was correct.  Complication was none.  Weight of the baby was 7 pounds 1 ounce.  The  patient tolerated procedure well and was transferred to recovery in  stable condition.      Maxie Better, M.D.  Electronically Signed     [place]/MEDQ  D:  07/16/2007  T:  07/17/2007  Job:  [id number]

## 2010-08-02 NOTE — Discharge Summary (Signed)
Bethany Morris, Bethany Morris               ACCOUNT NO.:  [id number]   MEDICAL RECORD NO.:  [id number]          PATIENT TYPE:  INP   LOCATION:  9374                          [facility]ILITY[facility]   PHYSICIAN:  Genia Del, M.D.DATE OF BIRTH:  [birth year]   DATE OF ADMISSION:  07/22/2007  DATE OF DISCHARGE:  07/24/2007                               DISCHARGE SUMMARY   ADMISSION DIAGNOSIS:  Postpartum preeclampsia.   DISCHARGE DIAGNOSIS:  Postpartum preeclampsia.   HOSPITAL COURSE:  The patient was admitted with nonspecific symptoms of  shortness of breath and chest pressure with a blood pressure at 137/96.  She had an EKG showing tachycardia, but otherwise sinus rhythm within  normal limits.  The chest x-ray was showing mild pulmonary edema and the  2-D cardiac echo was within normal limits.  She was started on labetalol  and Aldomet to control her blood pressure, and magnesium sulfate to  prevent seizures.  Her PIH labs showed improvement with decreasing uric  acid, just borderline AST, ALT and normal platelets.  She also responded  to Lasix and had good diuresis with improved shortness of breath and  resolution of the chest pressure.  She was discharged on Jul 24, 2007 in  good status with a stable blood pressure in the 120-140s/70-90s.  A  repeat chest x-ray was done that was within normal limits just before  discharge and she was discharged home with followup within a week at  [facility].      Genia Del, M.D.  Electronically Signed     [facility]/MEDQ  D:  08/13/2007  T:  08/14/2007  Job:  [id number]

## 2010-08-02 NOTE — Op Note (Signed)
NAMEJAYLAA, GALLION               ACCOUNT NO.:  [id number]   MEDICAL RECORD NO.:  [id number]          PATIENT TYPE:  INP   LOCATION:  9305                          [facility]   PHYSICIAN:  Adolph Pollack, M.D.DATE OF BIRTH:  Aug 07, 1975   DATE OF PROCEDURE:  08/29/2007  DATE OF DISCHARGE:                               OPERATIVE REPORT   PREOP DIAGNOSIS:  Bilateral breast abscesses.   POSTOP DIAGNOSIS:  Bilateral breast abscesses.   PROCEDURE:  Incision, drainage, and debridement of bilateral breast  abscess.   SURGEON:  Adolph Pollack, M.D.   ANESTHESIA:  General.   INDICATIONS:  This is a 35 year old female about 6 weeks postpartum who  developed a left breast abscess, it opened up and drained in the area on  the right side, but did not open up both in the circumareolar position.  She has been on antibiotics; however, continues to have pain.  Aspiration was done at the [facility] of both sides and gram positive  cocci in pairs and chains were noted.  She now brought to the operating  room for wide drainage and debridement.   TECHNIQUE:  She was brought to the operating, placed supine on the  operating table, and a general anesthetic was administered.  Both  breasts were sterilely prepped and draped.  The right breast had a small  draining sinus and using a hemostat, I widened this incision and then  made slightly larger incision at the 3 o'clock position in the  circumareolar area with the scalpel.  I drained out purulent material,  then debrided some of the firm indurated material.  Using finger  fracture technique, broke up loculations and then controlled the  bleeding with electrocautery.  A 0.25-inch Penrose drain was then placed  in the cavity of the wound and anchored to the inferior aspect of the  incision with a 4-0 nylon suture.   Following this, the right side was approached.  I made a circumareolar  incision in the lower outer quadrant and drained  a fair amount of  purulent material from this.  This cavity traced to the retroareolar  area.  I broke up loculations with the finger.  Some soft tissue  debridement was performed.  Bleeding from skin edges was controlled with  electrocautery.   Once hemostasis was adequate, a Penrose drain was put into the depth of  the cavity and then sewed to the inferior aspect of the incision with a  4-0 nylon suture.  Bulky dressings followed by an Ace wrap were then  applied.   She tolerated the procedure without any apparent complications and was  taken to recovery in satisfactory condition.      Adolph Pollack, M.D.  Electronically Signed     [facility]/MEDQ  D:  08/29/2007  T:  08/30/2007  Job:  [id number]   cc:   Maxie Better, M.D.  Fax: [phone]

## 2010-08-02 NOTE — Consult Note (Signed)
NAMEALFIE, Bethany Morris               ACCOUNT NO.:  [id number]   MEDICAL RECORD NO.:  [id number]          PATIENT TYPE:  INP   LOCATION:  9305                          [facility]   PHYSICIAN:  Adolph Pollack, M.D.DATE OF BIRTH:  [birth year]   DATE OF CONSULTATION:  08/28/2007  DATE OF DISCHARGE:                                 CONSULTATION   HISTORY OF PRESENT ILLNESS:  This is a 35 year old female who is about 6  weeks postpartum.  She is having intermittent breast pains for about a  week.  She noted, however, that her left breast became fairly tender at  3 o'clock position next to nipple areolar complex.  She has noticed some  blistering of the skin and then noticed some significant drainage of  bloody purulent type fluid, and when she massaged her breast continued  to drain.  She then noticed some hardness and pain in the lower outer  quadrant of the right breast.  She was seen by Dr. Cherly Hensen and was felt  to have mastitis with possible breast abscess.  She was admitted to  [facility] then was taken to the breast center when she underwent  bilateral breast ultrasounds by Dr. Anselmo Pickler.  The left breast  demonstrated a small amount of residual fluid, which he tried to  aspirate.  The right breast demonstrated what appeared to be a  multiloculated collection.  He is able to aspirate 15 mL and sent it for  culture.  Subsequently, I was asked to see her.   PAST MEDICAL HISTORY:  1. Polycystic ovary disease.  2. Thalassemia trait.  3. Gestational diabetes.  4. Migraine headaches.   PREVIOUS OPERATIONS:  Cesarean section.   ALLERGIES:  No known drug allergies.   CURRENT MEDICATIONS:  Include vancomycin, Rocephin, Tylox, and Tylenol.   SOCIAL HISTORY:  She is married and a Designer, jewellery.  No current  alcohol or tobacco use at this time.   REVIEW OF SYSTEMS:  CARDIOVASCULAR:  She states she does not have any  mitral valve prolapse or known heart disease.  PULMONARY:  No lung disease.  She does state that after her C-section  she had a little bit of what was felt to be volume overload requiring  some Lasix.  HEMATOLOGIC:  She states she does not have any free bleeding or blood  clots.   PHYSICAL EXAM:  GENERAL:  Well-developed, well-nourished female does not  appear to be in any acute distress.  VITAL SIGNS:  Temperature is 100.5, pulse is 116, blood pressure is  143/85, and O2 sat 97% room air.  RESPIRATORY:  The breath sounds are equal and clear, respirations  unlabored.  CARDIOVASCULAR:  Increased rate.  BREASTS.  The right breast demonstrates an indurated, tender  erythematous area in the lower outer quadrant and nipple-areolar  complex, but no fluctuance.  Left breast demonstrates a small wound with  a small amount of purulent drainage and not much induration and small  amount of erythema.  Notes no palpable supraclavicular or axillary  adenopathy.   Ultrasound findings were discussed with Dr. Jean Rosenthal.  IMPRESSION:  Bilateral breast abscesses with left that has drained  spontaneously and the right has been aspirated although it is  multiloculated by report on ultrasound.   PLAN:  I discussed with her the fact that likely she would require more  formal incision and drainage.  I told her we would reevaluate the  situation tomorrow after she will start receiving her vancomycin and  Rocephin and unless she is significantly better, we will try need to  proceed tomorrow or Friday with bilateral incisions and drainages.  I  went over the procedure and the risks with it.  Risks include not  limited to bleeding, infection, wound healing problems, anesthesia, and  recurrence of the abscesses.  She seems to understand this.      Adolph Pollack, M.D.  Electronically Signed     T[facility]/MEDQ  D:  08/28/2007  T:  08/29/2007  Job:  [id number]   cc:   Maxie Better, M.D.  Fax: [phone]

## 2010-08-05 NOTE — Discharge Summary (Signed)
Bethany Morris, Bethany Morris                         ACCOUNT NO.:  [id number]   MEDICAL RECORD NO.:  [id number]                   PATIENT TYPE:  INP   LOCATION:  9309                                 [facility]   PHYSICIAN:  Maxie Better, M.D.            DATE OF BIRTH:  01-15-1976   DATE OF ADMISSION:  08/11/2003  DATE OF DISCHARGE:  09/13/2003                                 DISCHARGE SUMMARY   ADMISSION DIAGNOSES:  1. Intrauterine growth restriction.  2. Intrauterine pregnancy at 30 and six-sevenths weeks.  3. Class A1 gestational diabetes.  4. Abnormal fetal tracing.   DISCHARGE DIAGNOSES:  1. Intrauterine pregnancy at 35 weeks, delivered.  2. Abnormal fetal tracing.  3. Intrauterine growth restriction.  4. Class A1 gestational diabetes.   PROCEDURE:  1. Primary cesarean section.  2. Amniocentesis on September 09, 2003.   HOSPITAL COURSE:  Mrs. Pala is a 35 year old gravida 1 para 0 married black  female at 20 and six-sevenths weeks gestation [facility]  after she presented from the office with a nonreactive nonstress test and  was found to have deceleration x2 down to the 80s on monitoring in the  maternity admissions area.  These decelerations were suggestive of late  decelerations and the patient was admitted for further evaluation.  Her  prenatal care had been complicated by gestational diabetes and intrauterine  growth restriction for which she was on bedrest and being followed closely.  The pregnancy was a Clomid conception.  Her history is also notable for  polycystic ovarian syndrome.  Ultrasound on Aug 04, 2003:  Estimated fetal  weight of 1271 g which was at the ninth percentile.  Dopplers were normal.  Amniotic fluid index was 12.2.  The patient was placed on continue fetal  monitoring and had intermittent decelerations throughout her  hospitalization.  Her blood sugars were followed closely.  She had  biophysical profiles and Doppler studies  performed throughout her course.  The patient remained in the hospital due to the intermittent abnormal  decelerations.  However, the additional testings were reassuring.  The  patient remained hospitalized up until her 35th week.  She underwent an  amniocentesis to document fetal lung maturity prior to planned delivery.  With documented lung maturity, a primary cesarean section was therefore  scheduled on September 10, 2003 in light of the already abnormal fetal tracing  and a cervix that was not favorable.  On September 10, 2003 the patient underwent  the cesarean section which resulted in delivery of a live female with cord  around the upper abdomen, Apgars of 8 and 8.  Placenta was calcified,  intact, sent to pathology.  Final pathology showed small-for-term placental  disc, no evidence of an infection.  Cord pH was also obtained.  The patient  subsequently had an unremarkable postoperative course.  By postoperative day  #3 she was deemed well to be discharged home.  Her  CBC on postoperative day  #1 showed a hemoglobin of 11.2; white count was 8.8; platelet count of  282,000.   DISPOSITION:  Home.   DISCHARGE MEDICATIONS:  1. Percocet 5 #20 one to two tablets q.3-4h. p.r.n. for pain.  2. Motrin 800 mg one p.o. q.6h. p.r.n. pain.  3. Prenatal vitamins one p.o. daily.   FOLLOW-UP:  Follow-up appointment is 4-6 weeks at [facility].   CONDITION:  Stable.   DISCHARGE INSTRUCTIONS:  Per the postpartum booklet given to the patient and  reviewed with the patient.                                               Maxie Better, M.D.    [place]/MEDQ  D:  10/28/2003  T:  10/29/2003  Job:  [id number]

## 2010-08-05 NOTE — Discharge Summary (Signed)
NAMEMARLOWE, CINQUEMANI               ACCOUNT NO.:  [id number]   MEDICAL RECORD NO.:  [id number]          PATIENT TYPE:  INP   LOCATION:  9103                          FACILITY:  [facility]   PHYSICIAN:  Maxie Better, M.D.DATE OF BIRTH:  04-15-1975   DATE OF ADMISSION:  07/16/2007  DATE OF DISCHARGE:  07/19/2007                               DISCHARGE SUMMARY   ADMISSION DIAGNOSES:  Previous cesarean section, term gestation, active  labor, and class A1 gestational diabetes.   DISCHARGE DIAGNOSES:  Term gestation delivered, previous cesarean  section, active labor, class A1 gestational diabetes, and gestational  hypertension.   PROCEDURE:  Repeat cesarean section.   HISTORY OF PRESENT ILLNESS:  A 35 year old gravida 2, para 0-1-0-1  female at 84 weeks with a previous cesarean section presented in active  labor with request for repeat cesarean section.   HOSPITAL COURSE:  The patient presented in active labor. She declined  attempted vaginal delivery. She was was taken to the operating room, she  underwent a repeat cesarean section.  The procedure resulted in delivery  of a live female, 7 pounds 1 ounce with meconium fluid, bilateral large  ovaries noted consistent with polycystic ovaries.  Apgars of 9 and 9.  Her postoperative course was notable for elevation of blood pressures to  140s/90s.  PIH labs were normal.  On postop day #1, her CBC showed a  hemoglobin of 9.2, hematocrit of 28.9, platelet counts of 305,000, white  count of 10.  With persistence of her blood pressures, which did not  require any blood pressure medications, PIH labs were obtained on Jul 19, 2007, that showed a normal labs, uric acid of 4.7.  The patient's  postoperative course was otherwise unremarkable.  She had lower  extremity edema, deep tendon reflex is 1+ and no clonus.  She had no  other symptoms.  By postop day #3, tolerating a regular diet.  Incision  without any evidence of infection.  She was deemed  well to be discharged  home.   DISPOSITION:  Home.   CONDITION:  Stable.   DISCHARGE MEDICATIONS:  1. Ultram 1-2 tablets every 4 hours p.r.n. pain.  2. Prenatal vitamins 1 p.o. daily.  3. Lasix 10 mg p.o. daily for 3 days due to the peripheral edema.   DISCHARGE INSTRUCTIONS:  Per the postpartum booklet given, followup  appointment at [facility] for staple removal and repeat blood  pressure check.  Postpartum visit in 6 weeks.      Maxie Better, M.D.  Electronically Signed     [place]/MEDQ  D:  08/07/2007  T:  08/07/2007  Job:  [id number]

## 2010-08-05 NOTE — Op Note (Signed)
NAMELILJA, SOLAND                           ACCOUNT NO.:  [id number]   MEDICAL RECORD NO.:  [id number]                   PATIENT TYPE:   LOCATION:                                       FACILITY:   PHYSICIAN:  Maxie Better, M.D.            DATE OF BIRTH:   DATE OF PROCEDURE:  09/10/2003  DATE OF DISCHARGE:                                 OPERATIVE REPORT   PREOPERATIVE DIAGNOSES:  1. Intrauterine growth restriction.  2. Intrauterine pregnancy at 35 weeks.  3. Class AI gestational diabetes.  4. Abnormal fetal tracing.   POSTOPERATIVE DIAGNOSES:  1. Intrauterine growth restriction.  2. Class A1 gestational diabetes.  3. Intrauterine pregnancy at 35 weeks.  4. Abnormal fetal tracing.   PROCEDURE:  Primary cesarean section.   ANESTHESIA:  Spinal.   SURGEON:  Maxie Better, M.D.   ASSISTANT:  Richardean Sale, M.D.   INDICATIONS FOR PROCEDURE:  A 35 year old gravida 1, para 0 female at [redacted]  weeks gestation with class AI gestational diabetes and intrauterine growth  restriction who was admitted to [facility] on Aug 12, 2003, for post  fetal surveillance after the patient, on antepartum testing, showed late  decelerations after contraction.  the patient remained hospitalized until  today due to the ongoing intermittent late deceleration noted with a  majority of her contractions.  The patient has been followed closely with  daily biophysical profiles and Doppler studies.  She has had alternating  elevated S/D ratios.  The patient underwent an amniocentesis on September 09, 2003, which documented fetal lung maturity with LS 2.6:1 with PG present.  Risks and benefits of the procedure had been explained to the patient given  the lung maturity and growth restriction and issue ongoing with the tracing.  The decision was made to proceed with delivery at this time.   DESCRIPTION OF PROCEDURE:  Under adequate spinal anesthesia, the patient was  placed in the supine  position with a left lateral tilt.  She was sterilely  prepped and draped in the usual fashion.  The bladder had an indwelling  Foley catheter placed.  A Pfannenstiel skin incision was made after 0.25%  Marcaine was injected along the planned incision line.  The incision was  carried down to the rectus fascia. The rectus fascia was incised in the  midline and extended bilaterally.  The rectus fascia was then bluntly and  sharply dissected off the rectus muscles in inferior fashion.  The rectus  muscle split in the midline.  Parietal peritoneum was entered bluntly and  extended.  The vesicouterine peritoneum was opened and the bladder was  bluntly dissected off the lower uterine segment and displaced inferiorly.  A  curvilinear low transverse uterine incision was then made and extended with  bandage scissors.  Artificial rupture of membranes was noted.  Clear fluid  seen.  Subsequent delivery of a live female infant was accomplished with cord  around  the upper body.  Nuchal cord was clamped and cut.  The baby was  transferred to the awaiting pediatricians who assigned Apgars of 8 and 8 at  one and five minutes.  The placenta was noted to be spontaneously intact,  calcified and sent to pathology.  Cord was also noted to be short.  There  was no uterine extension.  Uterine incision was closed in two layers, the  first layer was closed with 0 Monocryl running locked stitch.  The second  layer was imbricated using 0 Monocryl suture.  Good hemostasis was noted.  Abdomen was then copiously irrigated and suctioned of debris.  Normal tubes  were noted bilaterally.  Both ovaries were noted to be enlarged, consistent  with polycystic ovarian disease.  The parietal peritoneum was not closed.  The rectus fascia was then inspected.  Small bleeders cauterized.  The  rectus fascia was closed with 0 Vicryl x2.  The subcutaneous area was  irrigated and suctioned and small bleeders cauterized.  The skin was   approximated using Ethicon staples.  Cord pH has been obtained which was not  available at the time of dictation.  Weight of the baby was 4 pounds 5  ounces.   SPECIMENS:  Placenta sent to pathology.   ESTIMATED BLOOD LOSS:  600 mL.   INTRAOPERATIVE FLUID:  2300 mL Crystalloid.   URINE OUTPUT:  200 mL clear, yellow urine.   Sponge and instrument counts correct x2.   COMPLICATIONS:  None.   The patient tolerated the procedure well and was transferred to the recovery  room in stable condition.  The baby was transferred to the NICU due to an  apneic spell while in the operating room.                                               Maxie Better, M.D.    [place]/MEDQ  D:  09/10/2003  T:  09/10/2003  Job:  [id number]

## 2010-08-05 NOTE — Discharge Summary (Signed)
NAMEALYRICA, Bethany Morris               ACCOUNT NO.:  [id number]   MEDICAL RECORD NO.:  [id number]          PATIENT TYPE:  INP   LOCATION:  9305                          [facility]ILITY:  [facility]   PHYSICIAN:  Maxie Better, M.D.DATE OF BIRTH:  [birth year]   DATE OF ADMISSION:  08/28/2007  DATE OF DISCHARGE:  08/30/2007                               DISCHARGE SUMMARY   ADMISSION DIAGNOSES:  1. Bilateral breast abscesses.  2. Status post a cesarean section on July 16, 2007.   DISCHARGE DIAGNOSIS:  Bilateral breast abscesses.   PROCEDURE:  Incision, drainage, and debridement of bilateral breast  abscesses.   HISTORY OF PRESENT ILLNESS:  A 35 year old para 2 female status post  cesarean section on July 16, 2007, presented with 1-week history of  bilateral breast pain.  The patient had on her left breast an expulsion  of purulent drainage from the area of breast that had been previously  hard.  She had temperature of 100.8 at home.  The patient had no trauma.   HOSPITAL COURSE:  The patient had been evaluated in the office, was  noted to have bilateral clinical breast exam consistent with breast  abscesses.  She was admitted for management.  Surgical consultation was  obtained.  Wound culture had been done of the opened left breast abscess  area.  Bilateral breast sonograms were obtained.  IV antibiotics were  started, with consultation from Infectious Disease on the best IV  antibiotics.  The patient was placed on vancomycin and Rocephin.  Dr.  Abbey Chatters consulted and took the patient to the operating room for the  above surgery.  The sonogram of the breasts had revealed bilateral  abscesses.  Postoperatively, the patient noted a decrease in her pain.  Her cultures had shown Gram-positive cocci.  Breasts appeared less  indurated.  The patient was deemed well to be discharged home from a  surgical standpoint.  The patient was discharged home.   CONDITION:  Stable.   DISCHARGE MEDICATIONS:   Doxycycline for 3 weeks pending her wound  culture.  Follow up at Dr. Maris Berger office.   DISCHARGE INSTRUCTIONS:  To call for temperature greater than 100.4,  recurrence of her purulent discharge, and breast pain.      Maxie Better, M.D.  Electronically Signed     [place]/MEDQ  D:  10/10/2007  T:  10/10/2007  Job:  [id number]

## 2010-12-13 LAB — CBC
HCT: 28.5 — ABNORMAL LOW
HCT: 34.5 — ABNORMAL LOW
Hemoglobin: 9.1 — ABNORMAL LOW
MCHC: 31.9
MCHC: 32.2
MCV: 67.6 — ABNORMAL LOW
MCV: 68.3 — ABNORMAL LOW
RBC: 5.11
RDW: 19.8 — ABNORMAL HIGH
WBC: 13.4 — ABNORMAL HIGH

## 2010-12-15 LAB — CULTURE, ROUTINE-ABSCESS

## 2010-12-20 LAB — COMPREHENSIVE METABOLIC PANEL
ALT: 18
Albumin: 4.2
Alkaline Phosphatase: 95
Glucose, Bld: 109 — ABNORMAL HIGH
Potassium: 3.9
Sodium: 138
Total Protein: 7.4

## 2010-12-20 LAB — URINALYSIS, ROUTINE W REFLEX MICROSCOPIC
Bilirubin Urine: NEGATIVE
Glucose, UA: NEGATIVE
Hgb urine dipstick: NEGATIVE
Ketones, ur: NEGATIVE
Protein, ur: 30 — AB

## 2010-12-20 LAB — URINE MICROSCOPIC-ADD ON

## 2010-12-20 LAB — DIFFERENTIAL
Basophils Relative: 0
Eosinophils Absolute: 0.3
Lymphs Abs: 1.1
Monocytes Absolute: 0.3
Neutro Abs: 6.5

## 2010-12-20 LAB — CBC
Hemoglobin: 12.7
RDW: 16 — ABNORMAL HIGH

## 2010-12-20 LAB — POCT PREGNANCY, URINE: Preg Test, Ur: NEGATIVE

## 2013-03-20 NOTE — L&D Delivery Note (Signed)
Delivery Note At 2:00 PM a stillborn female was delivered via VBAC, Spontaneous (Presentation: ;  ).  APGAR: 0, 0; weight 8.6 oz (244 g).   Placenta status: Intact Abnormal, Spontaneous to path c/w abruption Pathology.  Cord: 3 vessels with the following complications: None.  Cord pH: n/a  Anesthesia: Epidural  Episiotomy: None Lacerations: None Suture Repair: none Est. Blood Loss (mL): 500  Mom to postpartum.  Baby to [place].  Bethany Morris A 01/16/2014, 2:30 PM

## 2013-09-22 ENCOUNTER — Other Ambulatory Visit: Payer: Self-pay

## 2013-11-02 LAB — OB RESULTS CONSOLE RUBELLA ANTIBODY, IGM: RUBELLA: IMMUNE

## 2013-11-02 LAB — OB RESULTS CONSOLE HIV ANTIBODY (ROUTINE TESTING): HIV: NONREACTIVE

## 2013-11-02 LAB — OB RESULTS CONSOLE RPR: RPR: NONREACTIVE

## 2013-11-02 LAB — OB RESULTS CONSOLE HEPATITIS B SURFACE ANTIGEN: HEP B S AG: NEGATIVE

## 2013-11-03 LAB — OB RESULTS CONSOLE GC/CHLAMYDIA
Chlamydia: NEGATIVE
Gonorrhea: NEGATIVE

## 2013-11-20 ENCOUNTER — Other Ambulatory Visit: Payer: Self-pay

## 2013-11-25 ENCOUNTER — Other Ambulatory Visit (HOSPITAL_COMMUNITY): Payer: Self-pay | Admitting: Obstetrics and Gynecology

## 2013-11-25 DIAGNOSIS — R772 Abnormality of alphafetoprotein: Secondary | ICD-10-CM

## 2013-11-25 DIAGNOSIS — O09529 Supervision of elderly multigravida, unspecified trimester: Secondary | ICD-10-CM

## 2013-12-02 ENCOUNTER — Ambulatory Visit (HOSPITAL_COMMUNITY)
Admission: RE | Admit: 2013-12-02 | Discharge: 2013-12-02 | Disposition: A | Discharge status: Home / Self Care | Payer: Medicare HMO | Source: Ambulatory Visit | Attending: Obstetrics and Gynecology | Admitting: Obstetrics and Gynecology

## 2013-12-02 ENCOUNTER — Ambulatory Visit (HOSPITAL_COMMUNITY)
Admission: RE | Admit: 2013-12-02 | Discharge: 2013-12-02 | Disposition: A | Discharge status: Home / Self Care | Payer: Managed Care, Other (non HMO) | Source: Ambulatory Visit | Attending: Obstetrics and Gynecology | Admitting: Obstetrics and Gynecology

## 2013-12-02 ENCOUNTER — Encounter (HOSPITAL_COMMUNITY): Payer: Self-pay

## 2013-12-02 ENCOUNTER — Other Ambulatory Visit (HOSPITAL_COMMUNITY): Payer: Self-pay | Admitting: Maternal and Fetal Medicine

## 2013-12-02 ENCOUNTER — Other Ambulatory Visit (HOSPITAL_COMMUNITY): Payer: Self-pay | Admitting: Obstetrics and Gynecology

## 2013-12-02 VITALS — BP 148/84 | HR 105 | Wt 176.0 lb

## 2013-12-02 DIAGNOSIS — R772 Abnormality of alphafetoprotein: Secondary | ICD-10-CM

## 2013-12-02 DIAGNOSIS — O289 Unspecified abnormal findings on antenatal screening of mother: Secondary | ICD-10-CM

## 2013-12-02 DIAGNOSIS — O365990 Maternal care for other known or suspected poor fetal growth, unspecified trimester, not applicable or unspecified: Secondary | ICD-10-CM | POA: Diagnosis not present

## 2013-12-02 DIAGNOSIS — R799 Abnormal finding of blood chemistry, unspecified: Secondary | ICD-10-CM | POA: Diagnosis not present

## 2013-12-02 DIAGNOSIS — O09529 Supervision of elderly multigravida, unspecified trimester: Secondary | ICD-10-CM

## 2013-12-02 DIAGNOSIS — O365921 Maternal care for other known or suspected poor fetal growth, second trimester, fetus 1: Secondary | ICD-10-CM

## 2013-12-02 NOTE — Progress Notes (Signed)
Genetic Counseling   DOB: Jul 23, 1975 Referring Provider: Serita Kyle, * Appointment Date: 12/02/2013 Attending: Particia Nearing, MD   Bethany. Bethany Morris was seen for consultation for genetic counseling because of an elevated MSAFP of 10.15 MoMs based on maternal serum AFP only screening.     We reviewed Bethany Morris's maternal serum screening result, the elevation of MSAFP, and the associated greater than 10% risk for an open fetal defect.   We reviewed open neural tube defects (ONTDs), the typical multifactorial etiology, and variable prognosis.  In addition, we discussed additional explanations for an elevated MSAFP including: normal variation, feto-maternal bleeding, a gestational dating error, abdominal wall defects, kidney differences, oligohydramnios, and placental problems.  We discussed that an unexplained elevation of MSAFP is associated with an increased risk for third trimester complications including: prematurity, low birth weight, and pre-eclampsia.    We reviewed additional available screening and diagnostic options including detailed ultrasound and amniocentesis.  We discussed the risks, limitations, and benefits of each.  After thoughtful consideration of these options, Bethany Morris elected to have ultrasound, but declined amniocentesis.  She understands that ultrasound cannot rule out all birth defects or genetic syndromes.  However, she was counseled that 80-90% of fetuses with ONTDs can be detected by detailed 2nd trimester ultrasound, when well visualized.  A complete ultrasound was performed today.  The ultrasound report will be sent under separate cover.  We also discussed Bethany Morris age of 67 and the associated 1 in 100 risk for a chromosome condition.  She understands that the cell free DNA screening she previously had performed is able to reduce the likelihood that a chromosome condition is present in her pregnancy, but does not eliminate that chance.  We reviewed the  detection rates and false positive rates for the screening performed as well as the difference between a screening test and a diagnostic test, such as amniocentesis.  She was comfortable with the reduced risk provided by the cell free DNA screen.  Bethany Morris was provided with written information regarding sickle cell anemia (SCA) including the carrier frequency and incidence in the African American population, the availability of carrier testing and prenatal diagnosis if indicated.  In addition, we discussed that hemoglobinopathies are routinely screened for as part of the [facility] newborn screening panel.   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 We discussed that alpha thalassemia is caused by changes in the alpha globin gene. There are two alpha globin chains on each of a persons chromosome 16 (aa/aa). A person can be a carrier of one alpha gene mutation (aa/a-) or of more than one mutation. A person who carries one alpha gene mutation is considered to be a silent carrier and would have a normal hemoglobin electrophoresis and would not have anemia related to the deletion.  A person who carries two alpha globin gene mutations can either carry them in cis (both on the same chromosome aa/--) or in trans (on different chromosomes a-/a-) . This carrier state is often referred to as alpha thalassemia "minor" and would manifest as mild anemia, low MCV and a normal hemoglobin electrophoresis (Hb Barts may be present at birth).  Deletion of 3 alpha globin genes (a-/--) is referred to as Hemoglobin H (HbH) disease and manifests with a low MCV, moderate anemia and the presence of HbH on electrophoresis (in newborns it presents with HbBarts).  The most severe form of alpha thalassemia, hydrops fetalis  with Hb Barts, is associated with an absence of alpha globin chain synthesis as a result of deletions of all four alpha globin genes (--/--).   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  In persons of African ancestry, two mutations are much more likely to occur in a trans pattern, than in cis.  It is estimated that 5-30% of individuals of African descent are alpha thalassemia carriers.  They usually have a mutation in a single alpha locus on one or both chromosomes #16 (aa/a- or a-/a-).  Thus the chance for an alpha thalassemia carrier of African descent to have a child with clinically significant alpha thalassemia is low, unless their partner is of another ethnicity (eg: Southeast Asian) at risk for the double gene deletion (aa/--).  We discussed the availability of hemoglobin electrophoresis for Bethany Morris husband to determine if he is a carrier for alpha thalassemia.  She was not interested in this option at this time.  Both family histories were reviewed and found to be noncontributory for birth defects, intellectual disability, and known genetic conditions.  Without further information regarding the provided family history, an accurate genetic risk cannot be calculated. Further genetic counseling is warranted if more information is obtained.  Bethany Morris denied exposure to environmental toxins or chemical agents. She denied the use of alcohol, tobacco or street drugs. She denied significant viral illnesses during the course of her pregnancy. Her medical and surgical histories were noncontributory.   I counseled this couple for approximately 45 minutes regarding the above risks and available options.    Mady Gemma, MS,  Certified Genetic Counselor

## 2013-12-23 ENCOUNTER — Ambulatory Visit (HOSPITAL_COMMUNITY)
Admission: RE | Admit: 2013-12-23 | Discharge: 2013-12-23 | Disposition: A | Discharge status: Home / Self Care | Payer: Managed Care, Other (non HMO) | Source: Ambulatory Visit | Attending: Obstetrics and Gynecology | Admitting: Obstetrics and Gynecology

## 2013-12-23 ENCOUNTER — Encounter (HOSPITAL_COMMUNITY): Payer: Self-pay

## 2013-12-23 ENCOUNTER — Other Ambulatory Visit (HOSPITAL_COMMUNITY): Payer: Self-pay | Admitting: Maternal and Fetal Medicine

## 2013-12-23 VITALS — BP 157/89 | HR 115 | Wt 180.0 lb

## 2013-12-23 DIAGNOSIS — O09522 Supervision of elderly multigravida, second trimester: Secondary | ICD-10-CM

## 2013-12-23 DIAGNOSIS — O09529 Supervision of elderly multigravida, unspecified trimester: Secondary | ICD-10-CM

## 2013-12-23 DIAGNOSIS — R772 Abnormality of alphafetoprotein: Secondary | ICD-10-CM | POA: Insufficient documentation

## 2013-12-23 DIAGNOSIS — O365930 Maternal care for other known or suspected poor fetal growth, third trimester, not applicable or unspecified: Secondary | ICD-10-CM | POA: Insufficient documentation

## 2013-12-23 DIAGNOSIS — Z3A00 Weeks of gestation of pregnancy not specified: Secondary | ICD-10-CM | POA: Insufficient documentation

## 2013-12-23 DIAGNOSIS — O365920 Maternal care for other known or suspected poor fetal growth, second trimester, not applicable or unspecified: Secondary | ICD-10-CM

## 2013-12-23 DIAGNOSIS — O34219 Maternal care for unspecified type scar from previous cesarean delivery: Secondary | ICD-10-CM

## 2013-12-23 DIAGNOSIS — O09292 Supervision of pregnancy with other poor reproductive or obstetric history, second trimester: Secondary | ICD-10-CM

## 2013-12-23 DIAGNOSIS — Z8751 Personal history of pre-term labor: Secondary | ICD-10-CM

## 2013-12-23 DIAGNOSIS — O09523 Supervision of elderly multigravida, third trimester: Secondary | ICD-10-CM | POA: Insufficient documentation

## 2013-12-23 DIAGNOSIS — O289 Unspecified abnormal findings on antenatal screening of mother: Secondary | ICD-10-CM

## 2014-01-13 ENCOUNTER — Ambulatory Visit (HOSPITAL_COMMUNITY)
Admission: RE | Admit: 2014-01-13 | Discharge: 2014-01-13 | Disposition: A | Discharge status: Home / Self Care | Payer: Managed Care, Other (non HMO) | Source: Ambulatory Visit | Attending: Urology | Admitting: Urology

## 2014-01-13 ENCOUNTER — Encounter (HOSPITAL_COMMUNITY): Payer: Self-pay | Admitting: *Deleted

## 2014-01-13 ENCOUNTER — Encounter (HOSPITAL_COMMUNITY): Payer: Self-pay

## 2014-01-13 ENCOUNTER — Other Ambulatory Visit (HOSPITAL_COMMUNITY): Payer: Self-pay | Admitting: Maternal and Fetal Medicine

## 2014-01-13 ENCOUNTER — Inpatient Hospital Stay (HOSPITAL_COMMUNITY)
Admission: AD | Admit: 2014-01-13 | Discharge: 2014-01-17 | DRG: 774 | Disposition: A | Discharge status: Home / Self Care | Payer: Managed Care, Other (non HMO) | Source: Ambulatory Visit | Attending: Obstetrics and Gynecology | Admitting: Obstetrics and Gynecology

## 2014-01-13 VITALS — BP 175/100 | HR 102 | Wt 178.0 lb

## 2014-01-13 DIAGNOSIS — O09522 Supervision of elderly multigravida, second trimester: Secondary | ICD-10-CM

## 2014-01-13 DIAGNOSIS — Z3689 Encounter for other specified antenatal screening: Secondary | ICD-10-CM

## 2014-01-13 DIAGNOSIS — O021 Missed abortion: Secondary | ICD-10-CM | POA: Diagnosis present

## 2014-01-13 DIAGNOSIS — O34219 Maternal care for unspecified type scar from previous cesarean delivery: Secondary | ICD-10-CM

## 2014-01-13 DIAGNOSIS — O365920 Maternal care for other known or suspected poor fetal growth, second trimester, not applicable or unspecified: Secondary | ICD-10-CM

## 2014-01-13 DIAGNOSIS — O289 Unspecified abnormal findings on antenatal screening of mother: Secondary | ICD-10-CM

## 2014-01-13 DIAGNOSIS — O3421 Maternal care for scar from previous cesarean delivery: Secondary | ICD-10-CM | POA: Diagnosis present

## 2014-01-13 DIAGNOSIS — O4592 Premature separation of placenta, unspecified, second trimester: Secondary | ICD-10-CM | POA: Diagnosis present

## 2014-01-13 DIAGNOSIS — IMO0002 Reserved for concepts with insufficient information to code with codable children: Secondary | ICD-10-CM

## 2014-01-13 DIAGNOSIS — O365120 Maternal care for known or suspected placental insufficiency, second trimester, not applicable or unspecified: Secondary | ICD-10-CM | POA: Diagnosis present

## 2014-01-13 DIAGNOSIS — O280 Abnormal hematological finding on antenatal screening of mother: Secondary | ICD-10-CM

## 2014-01-13 DIAGNOSIS — O99824 Streptococcus B carrier state complicating childbirth: Secondary | ICD-10-CM | POA: Diagnosis present

## 2014-01-13 DIAGNOSIS — O1002 Pre-existing essential hypertension complicating childbirth: Principal | ICD-10-CM | POA: Diagnosis present

## 2014-01-13 DIAGNOSIS — Z3A24 24 weeks gestation of pregnancy: Secondary | ICD-10-CM

## 2014-01-13 DIAGNOSIS — O132 Gestational [pregnancy-induced] hypertension without significant proteinuria, second trimester: Secondary | ICD-10-CM

## 2014-01-13 DIAGNOSIS — Z8751 Personal history of pre-term labor: Secondary | ICD-10-CM

## 2014-01-13 DIAGNOSIS — N858 Other specified noninflammatory disorders of uterus: Secondary | ICD-10-CM | POA: Diagnosis present

## 2014-01-13 DIAGNOSIS — O10012 Pre-existing essential hypertension complicating pregnancy, second trimester: Secondary | ICD-10-CM | POA: Diagnosis present

## 2014-01-13 DIAGNOSIS — O09292 Supervision of pregnancy with other poor reproductive or obstetric history, second trimester: Secondary | ICD-10-CM

## 2014-01-13 DIAGNOSIS — Z3A25 25 weeks gestation of pregnancy: Secondary | ICD-10-CM

## 2014-01-13 HISTORY — DX: Polycystic ovarian syndrome: E28.2

## 2014-01-13 HISTORY — DX: Papillomavirus as the cause of diseases classified elsewhere: B97.7

## 2014-01-13 HISTORY — DX: Essential (primary) hypertension: I10

## 2014-01-13 HISTORY — DX: Hirsutism: L68.0

## 2014-01-13 HISTORY — DX: Migraine, unspecified, not intractable, without status migrainosus: G43.909

## 2014-01-13 LAB — COMPREHENSIVE METABOLIC PANEL
ALBUMIN: 3.3 g/dL — AB (ref 3.5–5.2)
ALT: 25 U/L (ref 0–35)
AST: 23 U/L (ref 0–37)
Alkaline Phosphatase: 99 U/L (ref 39–117)
Anion gap: 14 (ref 5–15)
BUN: 11 mg/dL (ref 6–23)
CO2: 21 meq/L (ref 19–32)
CREATININE: 0.81 mg/dL (ref 0.50–1.10)
Calcium: 10 mg/dL (ref 8.4–10.5)
Chloride: 101 mEq/L (ref 96–112)
GFR calc Af Amer: >90 mL/min (ref >90)
GFR calc non Af Amer: >90 mL/min (ref >90)
Glucose, Bld: 87 mg/dL (ref 70–99)
Potassium: 4.2 mEq/L (ref 3.7–5.3)
SODIUM: 136 meq/L — AB (ref 137–147)
TOTAL PROTEIN: 7.6 g/dL (ref 6.0–8.3)
Total Bilirubin: <0.2 mg/dL — ABNORMAL LOW (ref 0.3–1.2)

## 2014-01-13 LAB — CBC
HEMATOCRIT: 41 % (ref 36.0–46.0)
HEMOGLOBIN: 13.2 g/dL (ref 12.0–15.0)
MCH: 23.4 pg — AB (ref 26.0–34.0)
MCHC: 32.2 g/dL (ref 30.0–36.0)
MCV: 72.6 fL — AB (ref 78.0–100.0)
PLATELETS: 248 10*3/uL (ref 150–400)
RBC: 5.65 MIL/uL — ABNORMAL HIGH (ref 3.87–5.11)
RDW: 18.5 % — ABNORMAL HIGH (ref 11.5–15.5)
WBC: 10.3 10*3/uL (ref 4.0–10.5)

## 2014-01-13 LAB — URINALYSIS, ROUTINE W REFLEX MICROSCOPIC
BILIRUBIN URINE: NEGATIVE
Glucose, UA: NEGATIVE mg/dL
HGB URINE DIPSTICK: NEGATIVE
Ketones, ur: NEGATIVE mg/dL
Leukocytes, UA: NEGATIVE
Nitrite: NEGATIVE
PH: 6 (ref 5.0–8.0)
Protein, ur: NEGATIVE mg/dL
SPECIFIC GRAVITY, URINE: 1.025 (ref 1.005–1.030)
Urobilinogen, UA: 0.2 mg/dL (ref 0.0–1.0)

## 2014-01-13 LAB — TYPE AND SCREEN
ABO/RH(D): O POS
ANTIBODY SCREEN: NEGATIVE

## 2014-01-13 LAB — PROTEIN / CREATININE RATIO, URINE
Creatinine, Urine: 115.66 mg/dL
Protein Creatinine Ratio: 0.26 — ABNORMAL HIGH (ref 0.00–0.15)
TOTAL PROTEIN, URINE: 30.2 mg/dL

## 2014-01-13 LAB — OB RESULTS CONSOLE GBS: GBS: POSITIVE

## 2014-01-13 LAB — GROUP B STREP BY PCR: Group B strep by PCR: POSITIVE — AB

## 2014-01-13 LAB — URIC ACID: URIC ACID, SERUM: 3.7 mg/dL (ref 2.4–7.0)

## 2014-01-13 LAB — LACTATE DEHYDROGENASE: LDH: 238 U/L (ref 94–250)

## 2014-01-13 LAB — ABO/RH: ABO/RH(D): O POS

## 2014-01-13 MED ORDER — PRENATAL MULTIVITAMIN CH
1.0000 | ORAL_TABLET | Freq: Every day | ORAL | Status: DC
  Administered 2014-01-14 – 2014-01-15 (×2): 1 via ORAL
  Filled 2014-01-13 (×2): qty 1

## 2014-01-13 MED ORDER — BETAMETHASONE SOD PHOS & ACET 6 (3-3) MG/ML IJ SUSP
12.0000 mg | INTRAMUSCULAR | Status: AC
  Administered 2014-01-13 – 2014-01-14 (×2): 12 mg via INTRAMUSCULAR
  Filled 2014-01-13 (×2): qty 2

## 2014-01-13 MED ORDER — ACETAMINOPHEN 325 MG PO TABS
650.0000 mg | ORAL_TABLET | ORAL | Status: DC | PRN
  Administered 2014-01-14: 650 mg via ORAL
  Filled 2014-01-13: qty 2

## 2014-01-13 MED ORDER — CALCIUM CARBONATE ANTACID 500 MG PO CHEW: 2.0000 | CHEWABLE_TABLET | ORAL | Status: DC | PRN

## 2014-01-13 MED ORDER — ZOLPIDEM TARTRATE 5 MG PO TABS
5.0000 mg | ORAL_TABLET | Freq: Every evening | ORAL | Status: DC | PRN
Start: 2014-01-13 — End: 2014-01-16

## 2014-01-13 MED ORDER — BETAMETHASONE SOD PHOS & ACET 6 (3-3) MG/ML IJ SUSP: 12.0000 mg | Freq: Once | INTRAMUSCULAR | Status: DC

## 2014-01-13 MED ORDER — DOCUSATE SODIUM 100 MG PO CAPS
100.0000 mg | ORAL_CAPSULE | Freq: Every day | ORAL | Status: DC
  Administered 2014-01-13 – 2014-01-15 (×3): 100 mg via ORAL
  Filled 2014-01-13 (×3): qty 1

## 2014-01-13 NOTE — Plan of Care (Signed)
Problem: Consults Goal: Neonatologist Consult Outcome: Progressing Rn spoke with dr Katrinka Blazingsmith about coming down for consultation.  Md plans on getting a report on this pt from his partner who has already spoken to obstetrician.

## 2014-01-13 NOTE — H&P (Addendum)
Bethany Morris is a 38 y.o. female G3P1102 MBF hx C/S x2 now @ 24 4/[redacted] weeks gestation presenting for admission 2nd  to severe IUGR noted again on sonogram done @ MFM today. Pt was also noted to have elevated BP ( 175/100) at that visit. Pt was seen shortly at my office with BP 124/82. Per Dr Sherrie Georgeecker( mFM). fetus is now measuring 19 weeks. Given the possibility of HTN and severe IUGR that is present, recommended to pt to be admitted for evaluation/monitoring BP r/o preeclampsia and to have pt be seen by NICU. Pt had nl informaseq. She was found to have elevated AFP and was sent to MFM where the  initial findings of growth restriction was noted. At that time, pt was extensively counselled and declined amniocentesis. Pt obstetrical hx was notable for previous growth restriction and PIH as well as hx GDM. Pt had nl early 1hr GCT. Sono today showed nl fluid, reverse doppler flow, efw 238 gram  tMaternal Medical History:  Fetal activity: Perceived fetal activity is normal.    Prenatal complications: IUGR.     OB History   Grav Para Term Preterm Abortions TAB SAB Ect Mult Living   3 2 1 1      2      Past Medical History  Diagnosis Date  . Hypertension   . HPV (human papilloma virus) infection   . Hirsutism   . PCOS (polycystic ovarian syndrome)   . Migraine    Past Surgical History  Procedure Laterality Date  . Cesarean section     Family History: family history is not on file. Social History:  reports that she has never smoked. She does not have any smokeless tobacco history on file. She reports that she does not drink alcohol or use illicit drugs.   Prenatal Transfer Tool  Maternal Diabetes: No Genetic Screening: Normal Maternal Ultrasounds/Referrals: Abnormal:  Findings:   IUGR Fetal Ultrasounds or other Referrals:  Referred to Materal Fetal Medicine  Maternal Substance Abuse:  No Significant Maternal Medications:  None Significant Maternal Lab Results:  Lab values include: Group B  Strep positive Other Comments:  elevated AFP  Review of Systems  All other systems reviewed and are negative.     Blood pressure 150/93, pulse 99, temperature 97.8 F (36.6 C), temperature source Oral, resp. rate 18, height 5\' 1"  (1.549 m), weight 82.555 kg (182 lb), last menstrual period 07/18/2013. Maternal Exam:  Abdomen: Patient reports no abdominal tenderness. Surgical scars: low transverse.   Introitus: Normal vulva.   Physical Exam  Constitutional: She is oriented to person, place, and time. She appears well-developed and well-nourished.  HENT:  Head: Atraumatic.  Eyes: EOM are normal.  Neck: Neck supple.  Cardiovascular: Regular rhythm.   Respiratory: Breath sounds normal.  GI: Soft.  Uterus @ umb  Musculoskeletal: She exhibits no edema.  Neurological: She is alert and oriented to person, place, and time.  Skin: Skin is warm and dry.  Psychiatric: She has a normal mood and affect.   VE deferred  Prenatal labs: ABO, Rh: --/--/O POS (10/27 1700) Antibody: NEG (10/27 1700) Rubella: Immune (08/16 0000) RPR: Nonreactive (08/16 0000)  HBsAg: Negative (08/16 0000)  HIV: Non-reactive (08/16 0000)  GBS: Positive (10/27 0000)   Assessment/Plan: Severe IUGR IUP @ 24 4/7 weeks Previous C/S x 2 Gestational HTN r/o preeclampsia P) admit. PIH labs. Fetal monitoring q shift, NICU consult. BMZ, GBS done. Antihypertensive med prn    Jeaninne Lodico A 01/13/2014, 7:22 PM  Addendum:  Pt seen this pm. Reviewed NICU consult note which was greatly appreciated. The note content was reviewed with pt who express understanding and confirmation of knowledge of its content( specifically fetal weight, inability to intubate baby at this size etc).  This was then used to allow further discussion regarding plan of care. Given the concern for inability to intubate this fetus were the fetus to be born now with the knowledge at best that we have, the decision options outlined to the pt was  as follow. 1) if confirm severe preeclampsia and jeopardy to her health, would need to proceed with delivery . [facility]( await urine protein/creatinine ratio), then the next issue is waiting for the fetus to potentially get to the weight that could give  it a chance with the understanding that while waiting we may encounter stillbirth . Per pt, prior to my arrival  She and her husband had discussed and agreed that if BP became an issue she would agree she needs to be delivered otherwise, they wanted to wait to give more time understanding that it is possible that this may not be achieved. The next issue was mode of delivery. The patient has had two previous C/S.  Given again the small size of both the fetus and the uterus, i informed the pt that it would be a difficult hysterotomy with potential for complication including hysterectomy, need for blood transfusion which can put her at risk such that her health also becomes a significant issue with poor outcome for the fetus. I therefore recommended a vaginal delivery when that time comes but also stated that in the process of delivery the fetus may not survive the process. Given not able to use cytotec, would use intracervical balloon for ripening of cervix with  pitocin when the time come for delivery if weight needed to survive was not achieved. Pt agrees with mode of delivery. 3). What to do if in the monitoring we encounter fetal heart rate deceleration, she also agree we would not intervene with a C/S. Her nurse, E. Foley was present for the entire conversation and reiterated the plan regarding deceleration mgmt in order to let her other oncoming colleague to be aware of the plan. Will therefore follow closely for evidence of preeclampsia, risk for placenta abruption exists as well as PPROM. Will repeat PIH in am. Offered chaplain visit, pt said that would be fine however she had already addressed that prior to coming to hospital.

## 2014-01-13 NOTE — Consult Note (Signed)
[facility]       01/13/2014    6:13 PM  I was called at the request of the patient's obstetrician (Dr. Cherly Hensenousins) to speak to this patient due to potential preterm delivery at 24+ weeks, complicated by severe IUGR.  I spoke to her along with the father of this fetus.  The patient's prenatal course includes elevated MSAFP, for which she was referred to MFM (seen on 12/02/13 by Dr. Sherrie Georgeecker;  Amniocentesis was declined by patient) and severe IUGR (most recent ultrasound places the fetus at 234 grams) with reversal of end-diastolic flow.  She is 24 4/7 weeks currently.  She is admitted to L&D, and is receiving treatment that includes betamethasone course.  I reviewed expectations for a baby born at 6724-25 weeks (about 50% survival), however such severe reduction of growth to 234 grams would make mortality essentially 100% given that we are unable to provide effective respiratory support for a baby that small.  (I would be unable to insert our smallest endotracheal tube).  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    I spent 20 minutes reviewing the record, speaking to the patient, and entering appropriate documentation.    _____________________ Electronically Signed By: Angelita InglesMcCrae S. Tansy Lorek, MD Neonatologist

## 2014-01-14 LAB — COMPREHENSIVE METABOLIC PANEL
ALT: 27 U/L (ref 0–35)
ANION GAP: 15 (ref 5–15)
AST: 25 U/L (ref 0–37)
Albumin: 3.1 g/dL — ABNORMAL LOW (ref 3.5–5.2)
Alkaline Phosphatase: 94 U/L (ref 39–117)
BUN: 10 mg/dL (ref 6–23)
CO2: 21 meq/L (ref 19–32)
CREATININE: 0.8 mg/dL (ref 0.50–1.10)
Calcium: 9.7 mg/dL (ref 8.4–10.5)
Chloride: 101 mEq/L (ref 96–112)
GFR calc Af Amer: >90 mL/min (ref >90)
Glucose, Bld: 148 mg/dL — ABNORMAL HIGH (ref 70–99)
Potassium: 4.1 mEq/L (ref 3.7–5.3)
Sodium: 137 mEq/L (ref 137–147)
Total Bilirubin: <0.2 mg/dL — ABNORMAL LOW (ref 0.3–1.2)
Total Protein: 7.7 g/dL (ref 6.0–8.3)

## 2014-01-14 LAB — CBC
HEMATOCRIT: 41 % (ref 36.0–46.0)
Hemoglobin: 13.2 g/dL (ref 12.0–15.0)
MCH: 23 pg — ABNORMAL LOW (ref 26.0–34.0)
MCHC: 32.2 g/dL (ref 30.0–36.0)
MCV: 71.6 fL — AB (ref 78.0–100.0)
Platelets: 274 10*3/uL (ref 150–400)
RBC: 5.73 MIL/uL — AB (ref 3.87–5.11)
RDW: 20.5 % — AB (ref 11.5–15.5)
WBC: 12.8 10*3/uL — ABNORMAL HIGH (ref 4.0–10.5)

## 2014-01-14 LAB — URIC ACID: URIC ACID, SERUM: 3.6 mg/dL (ref 2.4–7.0)

## 2014-01-14 MED ORDER — SODIUM CHLORIDE 0.9 % IJ SOLN
3.0000 mL | Freq: Two times a day (BID) | INTRAMUSCULAR | Status: DC
  Administered 2014-01-14 – 2014-01-15 (×4): 3 mL via INTRAVENOUS

## 2014-01-14 NOTE — Progress Notes (Signed)
HD#2 24 5/7 weeks Severe IUGR BMZ complete  S: feels (+) FM  Denies vaginal bleeding, ctx or leaking of fluid C/o bilateral thigh discomfort since lying in bed( just taken off stocking/boots)  multiple ? Regarding  NICU status( level), when next dopplers will be done, cause of reversal of flow  O: BP 160/88  Pulse 89  Temp(Src) 99 F (37.2 C) (Oral)  Resp 18  Ht 5\' 1"  (1.549 m)  Wt 82.555 kg (182 lb)  BMI 34.41 kg/m2  LMP 07/18/2013 Gen: in good/positive spirits Lungs clear to A Cor RRR Abd soft non tender uterus at umb pfannenstiel skin incision Pelvic deferred Ext. No calf tenderness or edema.  Tracing (+) FHR  No ctx  CBC    Component Value Date/Time   WBC 12.8* 01/14/2014 0911   RBC 5.73* 01/14/2014 0911   HGB 13.2 01/14/2014 0911   HCT 41.0 01/14/2014 0911   PLT 274 01/14/2014 0911   MCV 71.6* 01/14/2014 0911   MCH 23.0* 01/14/2014 0911   MCHC 32.2 01/14/2014 0911   RDW 20.5* 01/14/2014 0911   LYMPHSABS 1.1 01/29/2008 0321   MONOABS 0.3 01/29/2008 0321   EOSABS 0.3 01/29/2008 0321   BASOSABS 0.0 01/29/2008 0321    Uric acid AST25 ALT 27 Cr/protein: .26   IMP: severe IUGR PIH  GBS cx (+) IUP @ 24 5/7 weeks Prev C/S x 2 P) await  MFM input on testing( consult resulted). Cont inpt. Monitor closely for evidence of preeclampsia. Defer BP meds at this time. Keep BP stable

## 2014-01-15 ENCOUNTER — Inpatient Hospital Stay (HOSPITAL_COMMUNITY): Payer: Managed Care, Other (non HMO)

## 2014-01-15 MED ORDER — INFLUENZA VAC SPLIT QUAD 0.5 ML IM SUSY: 0.5000 mL | PREFILLED_SYRINGE | INTRAMUSCULAR | Status: DC

## 2014-01-15 NOTE — Progress Notes (Signed)
Per Dr Cherly Hensenousins heart rate on infant adequate.

## 2014-01-15 NOTE — Consult Note (Signed)
MFM Note  Ms. Bethany Morris is well known to me and MFM service:  Hospital day # 2 for pregnancy induced hypertension and severe fetal growth restriction.  Fetal US at 24+4 weeks (10/27): EFW < 10th %tile; 234 grams; low normal AFV; no structural abnormalities identified; both UA and DV dopplers show reverse diastolic flow  NICU consult: ~ 100 % mortality if delivered now; need to be at least ~ 400 grams for any chance of survival  Maternal status: BPs 130s -156/70s - 94; normal labs; negative urine protein on UA; no CNS, abdominal or pulmonary symptoms; reports good fetal movement  Assessment: 1) SIUP at 24+6 weeks 2) Gestational hypertension without severe features 3) Severe fetal growth restriction; very abnormal UA and DV dopplers indicating significant placental insufficiency 4) AMA; low risk NIPS 5) S/P C/S x 2 6) H/O FGR and preE 7) Very elevated MSAFP in this pregnancy (MoM 10.15) 8) S/P course of BMZ  Recommendations: 1) Growth US on 11/10 (14 days from last US; I have asked office to call floor with time prior to DC); possible dopplers/BPP at that time if EFW ~ 400 grams 2) OK for outpt management 3) Agree with no work for now 4) Modified bedrest 5) BP check, labs and viability next week and weekly (viability checks prn); treat with antihypotensives if BP consistently > 160/110 6) OK to continue ASA, Mg and protein drinks 7) Agree with C/S delivery: if delivery is indicated and fetus at least 400 grams; otherwise, induction  Please call with questions or concerns.  (Face-to-face consultation with patient: 30 min)

## 2014-01-15 NOTE — Progress Notes (Signed)
HD #3 24 6/7 wks Severe IUGR  S: no complaint (+) FM. Thigh pain resolved  O:  VSS BP 150/82( 139-156/74-94)  Lungs clear to A Cor RRR Abd: gravid soft nontender Pelvic" deferred Ext no edema or calf tenderness  (+) FHR 150   IMP: severe IUGR PIH no med IUP @ 24 6/7 P) per MFM, Dr Sherrie Georgeecker to see pt at bedside. Will need guidance on monitoring/sonographic. rpt PIH labs with change in BP

## 2014-01-16 ENCOUNTER — Encounter (HOSPITAL_COMMUNITY): Payer: Managed Care, Other (non HMO) | Admitting: Anesthesiology

## 2014-01-16 ENCOUNTER — Inpatient Hospital Stay (HOSPITAL_COMMUNITY): Payer: Managed Care, Other (non HMO)

## 2014-01-16 ENCOUNTER — Encounter (HOSPITAL_COMMUNITY): Payer: Self-pay | Admitting: Anesthesiology

## 2014-01-16 ENCOUNTER — Inpatient Hospital Stay (HOSPITAL_COMMUNITY): Payer: Managed Care, Other (non HMO) | Admitting: Anesthesiology

## 2014-01-16 DIAGNOSIS — IMO0002 Reserved for concepts with insufficient information to code with codable children: Secondary | ICD-10-CM | POA: Diagnosis present

## 2014-01-16 LAB — COMPREHENSIVE METABOLIC PANEL
ALT: 24 U/L (ref 0–35)
AST: 20 U/L (ref 0–37)
Albumin: 3.1 g/dL — ABNORMAL LOW (ref 3.5–5.2)
Alkaline Phosphatase: 102 U/L (ref 39–117)
Anion gap: 13 (ref 5–15)
BUN: 14 mg/dL (ref 6–23)
CHLORIDE: 103 meq/L (ref 96–112)
CO2: 21 meq/L (ref 19–32)
CREATININE: 0.77 mg/dL (ref 0.50–1.10)
Calcium: 9.6 mg/dL (ref 8.4–10.5)
GFR calc Af Amer: >90 mL/min (ref >90)
Glucose, Bld: 80 mg/dL (ref 70–99)
Potassium: 4.7 mEq/L (ref 3.7–5.3)
Sodium: 137 mEq/L (ref 137–147)
Total Protein: 7.9 g/dL (ref 6.0–8.3)

## 2014-01-16 LAB — CBC
HCT: 43.8 % (ref 36.0–46.0)
HEMOGLOBIN: 14.2 g/dL (ref 12.0–15.0)
MCH: 23.7 pg — ABNORMAL LOW (ref 26.0–34.0)
MCHC: 32.4 g/dL (ref 30.0–36.0)
MCV: 73.2 fL — AB (ref 78.0–100.0)
Platelets: 288 10*3/uL (ref 150–400)
RBC: 5.98 MIL/uL — AB (ref 3.87–5.11)
RDW: 18.7 % — ABNORMAL HIGH (ref 11.5–15.5)
WBC: 17.2 10*3/uL — ABNORMAL HIGH (ref 4.0–10.5)

## 2014-01-16 LAB — TYPE AND SCREEN
ABO/RH(D): O POS
Antibody Screen: NEGATIVE

## 2014-01-16 LAB — PROTIME-INR
INR: 1.13 (ref 0.00–1.49)
Prothrombin Time: 14.6 seconds (ref 11.6–15.2)

## 2014-01-16 LAB — PLATELET COUNT: PLATELETS: 244 10*3/uL (ref 150–400)

## 2014-01-16 LAB — APTT: APTT: 24 s (ref 24–37)

## 2014-01-16 LAB — SAVE SMEAR

## 2014-01-16 LAB — URIC ACID: URIC ACID, SERUM: 3.1 mg/dL (ref 2.4–7.0)

## 2014-01-16 LAB — FIBRINOGEN: Fibrinogen: 328 mg/dL (ref 204–475)

## 2014-01-16 MED ORDER — PRENATAL MULTIVITAMIN CH: 1.0000 | ORAL_TABLET | Freq: Every day | ORAL | Status: DC

## 2014-01-16 MED ORDER — FENTANYL 2.5 MCG/ML BUPIVACAINE 1/10 % EPIDURAL INFUSION (WH - ANES)
14.0000 mL/h | INTRAMUSCULAR | Status: DC | PRN
  Filled 2014-01-16: qty 125

## 2014-01-16 MED ORDER — BENZOCAINE-MENTHOL 20-0.5 % EX AERO
1.0000 "application " | INHALATION_SPRAY | CUTANEOUS | Status: DC | PRN
  Filled 2014-01-16: qty 56

## 2014-01-16 MED ORDER — OXYTOCIN BOLUS FROM INFUSION: 500.0000 mL | INTRAVENOUS | Status: DC

## 2014-01-16 MED ORDER — ONDANSETRON HCL 4 MG/2ML IJ SOLN: 4.0000 mg | INTRAMUSCULAR | Status: DC | PRN

## 2014-01-16 MED ORDER — OXYCODONE-ACETAMINOPHEN 5-325 MG PO TABS: 2.0000 | ORAL_TABLET | ORAL | Status: DC | PRN

## 2014-01-16 MED ORDER — ONDANSETRON HCL 4 MG PO TABS
4.0000 mg | ORAL_TABLET | ORAL | Status: DC | PRN
Start: 2014-01-16 — End: 2014-01-17

## 2014-01-16 MED ORDER — LABETALOL HCL 200 MG PO TABS
100.0000 mg | ORAL_TABLET | Freq: Two times a day (BID) | ORAL | Status: DC
  Administered 2014-01-16 – 2014-01-17 (×3): 100 mg via ORAL
  Filled 2014-01-16: qty 1
  Filled 2014-01-16: qty 0.5
  Filled 2014-01-16 (×2): qty 1

## 2014-01-16 MED ORDER — LACTATED RINGERS IV SOLN
500.0000 mL | Freq: Once | INTRAVENOUS | Status: DC
Start: 2014-01-16 — End: 2014-01-16

## 2014-01-16 MED ORDER — PHENYLEPHRINE 40 MCG/ML (10ML) SYRINGE FOR IV PUSH (FOR BLOOD PRESSURE SUPPORT): 80.0000 ug | PREFILLED_SYRINGE | INTRAVENOUS | Status: DC | PRN

## 2014-01-16 MED ORDER — TETANUS-DIPHTH-ACELL PERTUSSIS 5-2.5-18.5 LF-MCG/0.5 IM SUSP
0.5000 mL | Freq: Once | INTRAMUSCULAR | Status: DC
  Filled 2014-01-16: qty 0.5

## 2014-01-16 MED ORDER — ACETAMINOPHEN 325 MG PO TABS: 650.0000 mg | ORAL_TABLET | ORAL | Status: DC | PRN

## 2014-01-16 MED ORDER — HYDRALAZINE HCL 20 MG/ML IJ SOLN
5.0000 mg | Freq: Once | INTRAMUSCULAR | Status: AC
  Administered 2014-01-16: 5 mg via INTRAVENOUS
  Filled 2014-01-16: qty 1

## 2014-01-16 MED ORDER — NALBUPHINE HCL 10 MG/ML IJ SOLN: 5.0000 mg | INTRAMUSCULAR | Status: DC | PRN

## 2014-01-16 MED ORDER — BUTORPHANOL TARTRATE 1 MG/ML IJ SOLN
2.0000 mg | INTRAMUSCULAR | Status: DC | PRN
  Administered 2014-01-16: 2 mg via INTRAVENOUS
  Filled 2014-01-16: qty 2

## 2014-01-16 MED ORDER — ONDANSETRON HCL 4 MG/2ML IJ SOLN: 4.0000 mg | Freq: Four times a day (QID) | INTRAMUSCULAR | Status: DC | PRN

## 2014-01-16 MED ORDER — OXYTOCIN 40 UNITS IN LACTATED RINGERS INFUSION - SIMPLE MED
1.0000 m[IU]/min | INTRAVENOUS | Status: DC
  Administered 2014-01-16: 2 m[IU]/min via INTRAVENOUS
  Filled 2014-01-16: qty 1000

## 2014-01-16 MED ORDER — LACTATED RINGERS IV SOLN
500.0000 mL | INTRAVENOUS | Status: DC | PRN
  Administered 2014-01-16: 1000 mL via INTRAVENOUS

## 2014-01-16 MED ORDER — FENTANYL 2.5 MCG/ML BUPIVACAINE 1/10 % EPIDURAL INFUSION (WH - ANES)
INTRAMUSCULAR | Status: DC | PRN
  Administered 2014-01-16: 14 mL/h via EPIDURAL

## 2014-01-16 MED ORDER — LANOLIN HYDROUS EX OINT: TOPICAL_OINTMENT | CUTANEOUS | Status: DC | PRN

## 2014-01-16 MED ORDER — OXYTOCIN 40 UNITS IN LACTATED RINGERS INFUSION - SIMPLE MED: 62.5000 mL/h | INTRAVENOUS | Status: DC

## 2014-01-16 MED ORDER — DIPHENHYDRAMINE HCL 50 MG/ML IJ SOLN: 12.5000 mg | INTRAMUSCULAR | Status: DC | PRN

## 2014-01-16 MED ORDER — DIPHENHYDRAMINE HCL 25 MG PO CAPS: 25.0000 mg | ORAL_CAPSULE | Freq: Four times a day (QID) | ORAL | Status: DC | PRN

## 2014-01-16 MED ORDER — LACTATED RINGERS IV SOLN
INTRAVENOUS | Status: DC
  Administered 2014-01-16 (×2): via INTRAVENOUS

## 2014-01-16 MED ORDER — OXYCODONE-ACETAMINOPHEN 5-325 MG PO TABS: 1.0000 | ORAL_TABLET | ORAL | Status: DC | PRN

## 2014-01-16 MED ORDER — DIBUCAINE 1 % RE OINT
1.0000 "application " | TOPICAL_OINTMENT | RECTAL | Status: DC | PRN
  Filled 2014-01-16: qty 28

## 2014-01-16 MED ORDER — FERROUS SULFATE 325 (65 FE) MG PO TABS
325.0000 mg | ORAL_TABLET | Freq: Two times a day (BID) | ORAL | Status: DC
  Filled 2014-01-16 (×2): qty 1

## 2014-01-16 MED ORDER — CITRIC ACID-SODIUM CITRATE 334-500 MG/5ML PO SOLN
30.0000 mL | ORAL | Status: DC | PRN
Start: 2014-01-16 — End: 2014-01-16

## 2014-01-16 MED ORDER — WITCH HAZEL-GLYCERIN EX PADS: 1.0000 "application " | MEDICATED_PAD | CUTANEOUS | Status: DC | PRN

## 2014-01-16 MED ORDER — IBUPROFEN 600 MG PO TABS
600.0000 mg | ORAL_TABLET | Freq: Four times a day (QID) | ORAL | Status: DC
  Administered 2014-01-16: 600 mg via ORAL
  Filled 2014-01-16 (×2): qty 1

## 2014-01-16 MED ORDER — EPHEDRINE 5 MG/ML INJ: 10.0000 mg | INTRAVENOUS | Status: DC | PRN

## 2014-01-16 MED ORDER — LIDOCAINE HCL (PF) 1 % IJ SOLN
INTRAMUSCULAR | Status: DC | PRN
  Administered 2014-01-16 (×2): 4 mL

## 2014-01-16 MED ORDER — SENNOSIDES-DOCUSATE SODIUM 8.6-50 MG PO TABS
2.0000 | ORAL_TABLET | ORAL | Status: DC
  Administered 2014-01-17: 2 via ORAL
  Filled 2014-01-16: qty 2

## 2014-01-16 MED ORDER — INFLUENZA VAC SPLIT QUAD 0.5 ML IM SUSY: 0.5000 mL | PREFILLED_SYRINGE | INTRAMUSCULAR | Status: DC

## 2014-01-16 MED ORDER — ZOLPIDEM TARTRATE 5 MG PO TABS: 5.0000 mg | ORAL_TABLET | Freq: Every evening | ORAL | Status: DC | PRN

## 2014-01-16 MED ORDER — LIDOCAINE HCL (PF) 1 % IJ SOLN: 30.0000 mL | INTRAMUSCULAR | Status: DC | PRN

## 2014-01-16 MED ORDER — PHENYLEPHRINE 40 MCG/ML (10ML) SYRINGE FOR IV PUSH (FOR BLOOD PRESSURE SUPPORT)
80.0000 ug | PREFILLED_SYRINGE | INTRAVENOUS | Status: DC | PRN
  Filled 2014-01-16: qty 10

## 2014-01-16 MED ORDER — SIMETHICONE 80 MG PO CHEW: 80.0000 mg | CHEWABLE_TABLET | ORAL | Status: DC | PRN

## 2014-01-16 MED ORDER — OXYTOCIN 10 UNIT/ML IJ SOLN: 10.0000 [IU] | Freq: Once | INTRAMUSCULAR | Status: DC

## 2014-01-16 NOTE — Anesthesia Procedure Notes (Signed)
Epidural Patient location during procedure: OB Start time: 01/16/2014 12:45 PM  Staffing Anesthesiologist: Herbert Aguinaldo A. Performed by: anesthesiologist   Preanesthetic Checklist Completed: patient identified, site marked, surgical consent, pre-op evaluation, timeout performed, IV checked, risks and benefits discussed and monitors and equipment checked  Epidural Patient position: sitting Prep: site prepped and draped and DuraPrep Patient monitoring: continuous pulse ox and blood pressure Approach: midline Location: L4-L5 Injection technique: LOR air  Needle:  Needle type: Tuohy  Needle gauge: 17 G Needle length: 9 cm and 9 Needle insertion depth: 6 cm Catheter type: closed end flexible Catheter size: 19 Gauge Catheter at skin depth: 11 cm Test dose: negative and Other  Assessment Events: blood not aspirated, injection not painful, no injection resistance, negative IV test and no paresthesia  Additional Notes Patient identified. Risks and benefits discussed including failed block, incomplete  Pain control, post dural puncture headache, nerve damage, paralysis, blood pressure Changes, nausea, vomiting, reactions to medications-both toxic and allergic and post Partum back pain. All questions were answered. Patient expressed understanding and wished to proceed. Sterile technique was used throughout procedure. Epidural site was Dressed with sterile barrier dressing. No paresthesias, signs of intravascular injection Or signs of intrathecal spread were encountered.  Patient was more comfortable after the epidural was dosed. Please see RN's note for documentation of vital signs and FHR which are stable.

## 2014-01-16 NOTE — Progress Notes (Addendum)
S: pt coping   Had vaginal bleeding with subsequent fetal demise( confirmed by sonogram)  O: VS BP 160/91  Abd gravid soft nontender Pad: dark red blood on pad Ext; no edema or calf tenderness  IMP: Fetal demise prob 2nd to placental abruption/severe IUGR P) transfer to L&D for intracervical balloon and IV Pitocin.  Pt agrees with plan. PIH labs, T&S  Addendum Attempt placement of balloon( intracervical ) . No success due to no palp cervix (+) dark red blood Will start pitocin and come back with speculum for insertion attempt again

## 2014-01-16 NOTE — Progress Notes (Signed)
Patient ID: Bethany KluverShameka S Morris, female   DOB: 12/25/1975, 38 y.o.   MRN: [id number] Called to see pt by nurse for inability to locate FHR.  Per nurse she was last monitored last pm.  The pt reports last feeling movement last pm.   Sono: no cardiac activity noted.  No gross fetal movement noted.  Rec official sono to document IUFD. Nurse asked to notify Dr. Cherly Hensenousins. Pt notified that I did not see a FHR or movement and that an official sono would be obtained. Pt asked if she had questions and she said that she did not.  Bethany Morris, M.D., Evern CoreFACOG

## 2014-01-16 NOTE — Progress Notes (Signed)
Chaplain at bedside for consultation and guidance

## 2014-01-16 NOTE — Progress Notes (Signed)
I provided grief support and grief education to Von and Katharin as they held their son, [name].  They were grateful that they chose to hold him and to have pictures taken with him.  They were given resources for [facility] and [facility].  I offered prayer, blessing and pastoral presence.  Please page for continued spiritual needs that may arise.  [employer] CorporationChaplain Katy Gresham Caetano Pager, [phone] 3:27 PM

## 2014-01-16 NOTE — Progress Notes (Signed)
Bethany Morris and Bethany Morris are coping as well as can be expected.  They are at peace with it spiritually, though they know that there will still be grief ahead.  They are still considering whether or not they want to see their baby, [name], when he is born.  They don't want to be haunted by images later and want their memories of him to be negatively impacted.  I spoke with them about how many families find comfort in seeing and holding their baby and let them know that they would be seeing their baby through a parent's loving eyes if they choose to do so.    They had questions about arrangements.  They are leaning towards cremation, but have not made any decisions and I told them that they did not have to make any decision right now.    They have 2 living children [name](Christian, age 36 and [name], age 786) and they have family caring for them right now.  As of now, they do not want their children to come up, but I let them know that they could change their mind about that if they would like them to come up later.  They have a strong faith and that is helping them to cope.  We have already spoken about [facility] and [facility], but I will make sure they have that information before they leave.  We will continue to check in, but please also page throughout their time in the hospital as needed.  [employer] CorporationChaplain Katy Gilverto Dileonardo Pager, [phone] 10:35 AM   01/16/14 1000  Clinical Encounter Type  Visited With Patient and family together  Visit Type Spiritual support  Referral From Nurse  Spiritual Encounters  Spiritual Needs Emotional;Grief support  Stress Factors  Patient Stress Factors Loss

## 2014-01-16 NOTE — Plan of Care (Signed)
Problem: Phase I Progression Outcomes Goal: No significant worsening bleeding/cervix change/vag drainage No significant worsening in vaginal bleeding, cervical change, or vaginal drainage.  Outcome: Not Met (add Reason) Bleeding this AM 01/16/14

## 2014-01-16 NOTE — Progress Notes (Signed)
S: sleepy from stadol  O:   Pitocin BP 154/90   After IV apresaline.  ( BP 159-180/85-109)  Abd soft nontender Pelvic": speculum: (+) blood in vault: Intracervical balloon placed. Copious bloody fluid noted Ext: no edema  Labs>  CBC    Component Value Date/Time   WBC 17.2* 01/16/2014 0831   RBC 5.98* 01/16/2014 0831   HGB 14.2 01/16/2014 0831   HCT 43.8 01/16/2014 0831   PLT 288 01/16/2014 0831   MCV 73.2* 01/16/2014 0831   MCH 23.7* 01/16/2014 0831   MCHC 32.4 01/16/2014 0831   RDW 18.7* 01/16/2014 0831   LYMPHSABS 1.1 01/29/2008 0321   MONOABS 0.3 01/29/2008 0321   EOSABS 0.3 01/29/2008 0321   BASOSABS 0.0 01/29/2008 0321    Nl AST/ALT  IMP: Fetal demise Previous C/S x2 Placenta abruption PIH responsive to IV apresaline . Started on labetalol P) watch bleeding closely. DIC panel. Epidural prn. Cont pitocin. Obtain formal sono report from this am: vtx, placenta post w/o previa

## 2014-01-16 NOTE — Anesthesia Preprocedure Evaluation (Signed)
Anesthesia Evaluation  Patient identified by MRN, date of birth, ID band Patient awake    Reviewed: Allergy & Precautions, H&P , NPO status   Airway Mallampati: II  TM Distance: >3 FB Neck ROM: Full    Dental no notable dental hx. (+) Teeth Intact   Pulmonary neg pulmonary ROS,  breath sounds clear to auscultation  Pulmonary exam normal       Cardiovascular hypertension, Pt. on medications Rhythm:Regular Rate:Normal     Neuro/Psych  Headaches, negative psych ROS   GI/Hepatic negative GI ROS, Neg liver ROS,   Endo/Other  Obesity  Renal/GU   negative genitourinary   Musculoskeletal negative musculoskeletal ROS (+)   Abdominal (+) + obese,   Peds  Hematology negative hematology ROS (+)   Anesthesia Other Findings   Reproductive/Obstetrics (+) Pregnancy Previous C/Section x 2 IUFD at 19 weeks                             Anesthesia Physical Anesthesia Plan  ASA: II  Anesthesia Plan: Epidural   Post-op Pain Management:    Induction:   Airway Management Planned: Natural Airway  Additional Equipment:   Intra-op Plan:   Post-operative Plan:   Informed Consent: I have reviewed the patients History and Physical, chart, labs and discussed the procedure including the risks, benefits and alternatives for the proposed anesthesia with the patient or authorized representative who has indicated his/her understanding and acceptance.     Plan Discussed with: Anesthesiologist  Anesthesia Plan Comments:         Anesthesia Quick Evaluation

## 2014-01-17 LAB — RPR

## 2014-01-17 LAB — CBC
HEMATOCRIT: 35.1 % — AB (ref 36.0–46.0)
Hemoglobin: 11.1 g/dL — ABNORMAL LOW (ref 12.0–15.0)
MCH: 23.6 pg — AB (ref 26.0–34.0)
MCHC: 31.9 g/dL (ref 30.0–36.0)
MCV: 73.9 fL — AB (ref 78.0–100.0)
PLATELETS: 186 10*3/uL (ref 150–400)
RBC: 4.75 MIL/uL (ref 3.87–5.11)
RDW: 18.4 % — ABNORMAL HIGH (ref 11.5–15.5)
WBC: 18.4 10*3/uL — ABNORMAL HIGH (ref 4.0–10.5)

## 2014-01-17 MED ORDER — LABETALOL HCL 100 MG PO TABS: 100.0000 mg | ORAL_TABLET | Freq: Two times a day (BID) | ORAL | Status: DC

## 2014-01-17 MED ORDER — IBUPROFEN 600 MG PO TABS: 600.0000 mg | ORAL_TABLET | Freq: Four times a day (QID) | ORAL | Status: DC

## 2014-01-17 NOTE — Progress Notes (Signed)
Late entry  Called by RN stating pt delivered. I was already en route to hospital. On arrival, informed baby en caul along with placenta came out when she pulling on balloon which  also came out.  Baby was stillborn female. Exam  Showed intact perineum, small amount blood. Baby grossly normal. Markedly abnormal placenta with evidence of abruption. Pt declined autopsy or sending tissue for chromosomal analysis. Placenta to path, To remain overnight to monitor BP and vaginal  bleeding

## 2014-01-17 NOTE — Progress Notes (Signed)
Discharge instructions reviewed with patient.  Patient states understanding of home care, medications, activity, signs/symptoms to report to MD and return MD office visit.  Patients significant other will assist with her care @ home and no home equipment needed.  Patient discharged in stable condition via wheelchair with staff without incident.

## 2014-01-17 NOTE — Progress Notes (Signed)
PPD1 SVD:   S:  Pt reports feeling well/ Tolerating po/ Voiding without problems/ No n/v/ Bleeding is light declined pain med   O:  A & O x 3 stable / VS: Blood pressure 158/91, pulse 79, temperature 98.3 F (36.8 C), temperature source Oral, resp. rate 16, height 5\' 1"  (1.549 m), weight 82.555 kg (182 lb), last menstrual period 07/18/2013, SpO2 98.00%.  LABS:  Results for orders placed during the hospital encounter of 01/13/14 (from the past 24 hour(s))  TYPE AND SCREEN     Status: None   Collection Time    01/16/14  8:31 AM      Result Value Ref Range   ABO/RH(D) O POS     Antibody Screen NEG     Sample Expiration 01/19/2014    CBC     Status: Abnormal   Collection Time    01/16/14  8:31 AM      Result Value Ref Range   WBC 17.2 (*) 4.0 - 10.5 K/uL   RBC 5.98 (*) 3.87 - 5.11 MIL/uL   Hemoglobin 14.2  12.0 - 15.0 g/dL   HCT 95.243.8  84.136.0 - 32.446.0 %   MCV 73.2 (*) 78.0 - 100.0 fL   MCH 23.7 (*) 26.0 - 34.0 pg   MCHC 32.4  30.0 - 36.0 g/dL   RDW 40.118.7 (*) 02.711.5 - 25.315.5 %   Platelets 288  150 - 400 K/uL  COMPREHENSIVE METABOLIC PANEL     Status: Abnormal   Collection Time    01/16/14  8:31 AM      Result Value Ref Range   Sodium 137  137 - 147 mEq/L   Potassium 4.7  3.7 - 5.3 mEq/L   Chloride 103  96 - 112 mEq/L   CO2 21  19 - 32 mEq/L   Glucose, Bld 80  70 - 99 mg/dL   BUN 14  6 - 23 mg/dL   Creatinine, Ser 6.640.77  0.50 - 1.10 mg/dL   Calcium 9.6  8.4 - 40.310.5 mg/dL   Total Protein 7.9  6.0 - 8.3 g/dL   Albumin 3.1 (*) 3.5 - 5.2 g/dL   AST 20  0 - 37 U/L   ALT 24  0 - 35 U/L   Alkaline Phosphatase 102  39 - 117 U/L   Total Bilirubin <0.2 (*) 0.3 - 1.2 mg/dL   GFR calc non Af Amer >90  >90 mL/min   GFR calc Af Amer >90  >90 mL/min   Anion gap 13  5 - 15  URIC ACID     Status: None   Collection Time    01/16/14  8:31 AM      Result Value Ref Range   Uric Acid, Serum 3.1  2.4 - 7.0 mg/dL  PROTIME-INR     Status: None   Collection Time    01/16/14 12:07 PM      Result Value  Ref Range   Prothrombin Time 14.6  11.6 - 15.2 seconds   INR 1.13  0.00 - 1.49  APTT     Status: None   Collection Time    01/16/14 12:07 PM      Result Value Ref Range   aPTT 24  24 - 37 seconds  FIBRINOGEN     Status: None   Collection Time    01/16/14 12:07 PM      Result Value Ref Range   Fibrinogen 328  204 - 475 mg/dL  PLATELET COUNT  Status: None   Collection Time    01/16/14 12:07 PM      Result Value Ref Range   Platelets 244  150 - 400 K/uL  SAVE SMEAR     Status: None   Collection Time    01/16/14 12:07 PM      Result Value Ref Range   Smear Review SMEAR STAINED AND AVAILABLE FOR REVIEW    CBC     Status: Abnormal   Collection Time    01/17/14  5:00 AM      Result Value Ref Range   WBC 18.4 (*) 4.0 - 10.5 K/uL   RBC 4.75  3.87 - 5.11 MIL/uL   Hemoglobin 11.1 (*) 12.0 - 15.0 g/dL   HCT 16.135.1 (*) 09.636.0 - 04.546.0 %   MCV 73.9 (*) 78.0 - 100.0 fL   MCH 23.6 (*) 26.0 - 34.0 pg   MCHC 31.9  30.0 - 36.0 g/dL   RDW 40.918.4 (*) 81.111.5 - 91.415.5 %   Platelets 186  150 - 400 K/uL    I&O: I/O last 3 completed shifts: In: -  Out: 1300 [Urine:800; Blood:500]      Lungs: chest clear, no wheezing, rales, normal symmetric air entry, Heart exam - S1, S2 normal, no murmur, no gallop, rate regular  Heart: regular rate and rhythm, S1, S2 normal, no murmur, click, rub or gallop  Abdomen: benign non-tender, without masses or organomegaly palpable  Perineum: is normal  Lochia: scant  Extremities:no redness or tenderness in the calves or thighs, no edema    A/P: PPD # 1/ [id number] PIH controlled on med( labetalol) Coping well. Desires discharge. Plans for fetus being made P) dc home D/c instructions reviewed F/u 2 weeks BP check OOW 6 weeks

## 2014-01-17 NOTE — Discharge Instructions (Signed)
Call if temperature greater than equal to 100.4, nothing per vagina for 4-6 weeks or severe nausea vomiting, increased incisional pain , drainage or redness in the incision site, no straining with bowel movements, showers no bath °

## 2014-01-17 NOTE — Discharge Summary (Signed)
Obstetric Discharge Summary Reason for Admission: severe IUGR. PIH r/o preeclampsia Prenatal Procedures: ultrasound Intrapartum Procedures: spontaneous vaginal delivery, intracervical balloon Postpartum Procedures: none Complications-Operative and Postpartum: none Hemoglobin  Date Value Ref Range Status  01/17/2014 11.1* 12.0 - 15.0 g/dL Final     DELTA CHECK NOTED     REPEATED TO VERIFY     HCT  Date Value Ref Range Status  01/17/2014 35.1* 36.0 - 46.0 % Final    Physical Exam:  General: alert, cooperative and no distress Lochia: appropriate Uterine Fundus: firm Incision: n/a DVT Evaluation: No evidence of DVT seen on physical exam.  hospital course: pt was admitted at 24 4/7 weeks due to elevated BP and severe IUGR. Pt was initially found to have growth restrictions @ 18 weeks when she was evaluated for elevated AFP. Pt declined amniocentesis. All other w/u was neg and pt had nl informaseq. Pt was followed by MFM and on the day of admit was found to have a fetus with EFWof 238 gm and c/w [redacted] weeks gestation. BP 175/100 was noted in MFM office. Pt denied any assoc PIH warning signs. Pt had PIH labs done on admission. She was given BMZ x2. Pt was seen by NICU. On the day of delivery pt had vaginal bleeding with subsequent fetal demise. Hx notable for Previous C/S x 2. Pt underwent intracervical balloon and Pitocin induction. She was given Epidural for pain management. Pt had spontaneous vaginal delivery en caul of fetus ( female) along with placenta . Placenta was sent to path. Pt declined autopsy or genetic testing. Intrapartum she received IV apresaline for elevation of BP and was started on labetalol. Pt was kept overnight for monitoring of bleeding and BP.  She did well with coping postpartum and desired discharge next day. BP was controlled with current med.  Discharge Diagnoses: severe IUGR,IUFD delivered. PIH, placental abruption, IUP @ 24 6/7 weeks  Discharge Information: Date:  01/17/2014 Activity: pelvic rest Diet: routine Medications: Ibuprofen and labetalol Condition: stable Instructions: refer to practice specific booklet Discharge to: home Follow-up Information   Follow up with Jayleana Colberg A, MD In 6 weeks.   Specialty:  Obstetrics and Gynecology   Contact information:   [address] [place] [id number] [phone]       Newborn Data: Josefine ClassStillborn born female  Birth Weight: 8.6 oz (244 g) APGAR: 0, 0  .  Mykell Genao A 01/17/2014, 6:41 AM

## 2014-01-17 NOTE — Anesthesia Postprocedure Evaluation (Signed)
Anesthesia Post Note  Patient: Bethany KluverShameka S Angeletti  Procedure(s) Performed: * No procedures listed *  Anesthesia type: Epidural  Patient location: [facility]  Post pain: Pain level controlled  Post assessment: Post-op Vital signs reviewed  Last Vitals:  Filed Vitals:   01/17/14 0606  BP: 158/91  Pulse: 79  Temp: 36.8 C  Resp: 16    Post vital signs: Reviewed  Level of consciousness:alert  Complications: No apparent anesthesia complications

## 2014-01-19 NOTE — Progress Notes (Signed)
Post discharge ur review completed. 

## 2014-01-29 ENCOUNTER — Encounter (HOSPITAL_COMMUNITY): Payer: Self-pay | Admitting: Anesthesiology

## 2014-03-10 ENCOUNTER — Encounter (HOSPITAL_COMMUNITY): Payer: Self-pay

## 2014-03-10 ENCOUNTER — Other Ambulatory Visit (HOSPITAL_COMMUNITY): Payer: Self-pay

## 2014-03-10 ENCOUNTER — Ambulatory Visit (HOSPITAL_COMMUNITY): Admission: RE | Admit: 2014-03-10 | Payer: Managed Care, Other (non HMO) | Source: Ambulatory Visit

## 2014-03-10 ENCOUNTER — Ambulatory Visit (HOSPITAL_COMMUNITY)
Admission: RE | Admit: 2014-03-10 | Discharge: 2014-03-10 | Disposition: A | Discharge status: Home / Self Care | Payer: Managed Care, Other (non HMO) | Source: Ambulatory Visit | Attending: Obstetrics and Gynecology | Admitting: Obstetrics and Gynecology

## 2014-03-10 DIAGNOSIS — I10 Essential (primary) hypertension: Secondary | ICD-10-CM | POA: Insufficient documentation

## 2014-03-10 DIAGNOSIS — Z3169 Encounter for other general counseling and advice on procreation: Secondary | ICD-10-CM | POA: Diagnosis present

## 2014-03-10 NOTE — Progress Notes (Signed)
MATERNAL FETAL MEDICINE CONSULT  Patient Name: Bethany KluverShameka S Morris Medical Record Number:  [id number] Date of Birth: 05/18/1975 Requesting Physician Name:  Serita KyleSheronette A Cousins, MD Date of Service: 03/10/2014  Chief Complaint History of preeclampsia and fetal growth restriction in several prior pregnancies  History of Present Illness Bethany Morris was seen today for preconceptual counseling secondary to a history of preeclampsia and fetal growth restriction in several prior pregnancies at the request of Serita KyleSheronette A Cousins, MD.  The patient is a 38 y.o. [phone], who is currently not pregnancy, who has a history of early onset preeclampsia and fetal growth restriction in multiple prior pregnancies.  In her first pregnancy she developed preeclampsia and FGR beginning at 30 weeks in her first pregnancy.  She went on to deliver a small for gestational age infant at 2436 weeks.  Her second pregnancy was complicated by GDM, but she delivered a LGA infant at term.  She developed preeclampsia postpartum.  In her most recent pregnancy she was found to have a severely elevated MSAFP of approximately 10 and went on to develop very early onset fetal growth restriction.  Unfortunately, she went on to have an IUFD at 24 weeks, followed by a vaginal delivery after an induction of labor.  Since delivery she has continued to have elevated blood pressures and was started on Bistolic by Dr. Cherly Hensenousins.  She has an appointment with a primary care physician for ongoing management of her hypertension.    Review of Systems Pertinent items are noted in HPI.  Patient History OB History  Gravida Para Term Preterm AB SAB TAB Ectopic Multiple Living  4 3 1 2      2     # Outcome Date GA Lbr Len/2nd Weight Sex Delivery Anes PTL Lv  4 Gravida           3 Preterm 01/16/14 [id number]  8.6 oz (0.244 kg) M VBAC EPI  FD  2 Preterm           1 Term               Past Medical History  Diagnosis Date  . Hypertension   . HPV (human  papilloma virus) infection   . Hirsutism   . PCOS (polycystic ovarian syndrome)   . Migraine     Past Surgical History  Procedure Laterality Date  . Cesarean section      History   Social History  . Marital Status: Married    Spouse Name: N/A    Number of Children: N/A  . Years of Education: N/A   Social History Main Topics  . Smoking status: Never Smoker   . Smokeless tobacco: Not on file  . Alcohol Use: No  . Drug Use: No  . Sexual Activity: Yes   Other Topics Concern  . Not on file   Social History Narrative    Family History There is no family history of mental retardation, birth defects, or genetic diseases.  Assessment and Recommendations 1.  History of early onset preeclampsia and fetal growth restriction in multiple prior pregnancies.  The exact etiology of Bethany Morris prior obstetric complications is unclear, but they are likely multifactorial, with chronic hypertension, poor placentation, and preeclampsia contributing.  A work up investigating the cause of the IUFD was performed at the time of her loss and did not provide any explanation.  Additional testing can be performed at this time to determine if Bethany Morris has any other undiagnosed chronic medical conditions, that  if treated would increase the likelihood of a future successful pregnancy.  An appropriate workup would include testing for anti-phospholipid testing (anti-cardiolipin and anti-beta2-microglobulin antibodies, and lupus anticoagulant testing), thyroid disease (TSH, free T4, and free T3), and diabetes mellitus (a 75 gram glucose tolerance test).  If Bethany Morris turns out to have thyroid disease or diabetes mellitus these conditions should be medically optimized, along with her hypertension, prior to any further attempts at conception.  Even if Bethany Morris does not meet criteria for anti-phospholipid syndrome she should take 81 mg of aspirin daily during any future pregnancies due to her poor obstetric history.   If she is diagnosed with anti-phospholipid syndrome she should also receive prophylactic anti-coagulation during pregnancy.  The above mentioned laboratory tests were not ordered today, as it is more apporpriate for them to be ordered by her primart care provider who will be taking charge of Bethany Morris medical care.  Overall Bethany Morris risk of recurrent early onset preeclampsia and IGUR is reasonably high as she has had two prior episodes with the most recent at 24 weeks.  However, pursuing another pregnancy is reasonable in Bethany Morris and we would be happy to assist in her prenatal care should she conceive.   Bethany Morris [facility] after her testing has been completed to discuss any implications of the findings.  We would be happy to do so, please simply call to schedule another consult when testing is completed.  I spent 30 minutes with Bethany Morris today of which 50% was face-to-face counseling.  [facility].  Please do not hesitate to contact us with questions.   Rema FendtNITSCHE,Nayquan Evinger, MD

## 2018-03-04 ENCOUNTER — Ambulatory Visit
Admission: EM | Admit: 2018-03-04 | Discharge: 2018-03-04 | Disposition: A | Discharge status: Home / Self Care | Payer: 59 | Attending: Family Medicine | Admitting: Family Medicine

## 2018-03-04 DIAGNOSIS — J019 Acute sinusitis, unspecified: Secondary | ICD-10-CM | POA: Diagnosis not present

## 2018-03-04 MED ORDER — AMOXICILLIN-POT CLAVULANATE 875-125 MG PO TABS: 1.0000 | ORAL_TABLET | Freq: Two times a day (BID) | ORAL | 0 refills | Status: DC

## 2018-03-04 NOTE — Discharge Instructions (Addendum)
I am treating you for a sinus infection Try some zyrtec D for congestion  Continue the Flonase. Follow up as needed for continued or worsening symptoms

## 2018-03-04 NOTE — ED Triage Notes (Signed)
Pt c/o sinus congestion x1.5 wk with no relief with OTC meds

## 2018-03-04 NOTE — ED Provider Notes (Signed)
[facility]    CSN: [id number] Arrival date & time: 03/04/18  1006     History   Chief Complaint Chief Complaint  Patient presents with  . Nasal Congestion    HPI Bethany Morris is a 42 y.o. female.   Patient is a 42 year old female presents with severe nasal congestion, thick mucus, mild sinus tenderness, and trouble breathing through her nares.  This is been ongoing for approximately a week and a half.  Her symptoms have worsened.  She has been using Flonase, Zyrtec without much relief of symptoms.  She denies any history of sinus infections.  She denies any associated fever, chills, body aches, night sweats.  She denies any dental pain     ROS per HPI  Past Medical History:  Diagnosis Date  . Hirsutism   . HPV (human papilloma virus) infection   . Hypertension   . Migraine   . PCOS (polycystic ovarian syndrome)     Patient Active Problem List   Diagnosis Date Noted  . Stillbirth of single fetus 01/16/2014  . Postpartum care following vaginal delivery (10/30) 01/16/2014  . IUGR (intrauterine growth restriction) 01/13/2014    Past Surgical History:  Procedure Laterality Date  . CESAREAN SECTION      OB History    Gravida  4   Para  3   Term  1   Preterm  2   AB      Living  2     SAB      TAB      Ectopic      Multiple      Live Births               Home Medications    Prior to Admission medications   Medication Sig Start Date End Date Taking? Authorizing Provider  amoxicillin-clavulanate (AUGMENTIN) 875-125 MG tablet Take 1 tablet by mouth every 12 (twelve) hours. 03/04/18   Dahlia ByesBast, Krystina Strieter A, NP  ibuprofen (ADVIL,MOTRIN) 600 MG tablet Take 1 tablet (600 mg total) by mouth every 6 (six) hours. 01/17/14   Maxie Betterousins, Sheronette, MD  labetalol (NORMODYNE) 100 MG tablet Take 1 tablet (100 mg total) by mouth 2 (two) times daily. 01/17/14   Maxie Betterousins, Sheronette, MD  Prenatal Vit-Fe Fumarate-FA (PRENATAL MULTIVITAMIN) TABS tablet  Take 1 tablet by mouth daily at 12 noon.    [provider]    Family History No family history on file.  Social History Social History   Tobacco Use  . Smoking status: Never Smoker  . Smokeless tobacco: Never Used  Substance Use Topics  . Alcohol use: No  . Drug use: No     Allergies   Patient has no known allergies.   Review of Systems Review of Systems   Physical Exam Triage Vital Signs ED Triage Vitals  Enc Vitals Group     BP 03/04/18 1021 (!) 145/85     Pulse Rate 03/04/18 1021 88     Resp 03/04/18 1021 18     Temp 03/04/18 1021 97.8 F (36.6 C)     Temp Source 03/04/18 1021 Oral     SpO2 03/04/18 1021 100 %     Weight 03/04/18 1022 178 lb (80.7 kg)     Height 03/04/18 1022 5\' 1"  (1.549 m)     Head Circumference --      Peak Flow --      Pain Score 03/04/18 1022 0     Pain Loc --  Pain Edu? --      Excl. in GC? --    No data found.  Updated Vital Signs BP (!) 145/85 (BP Location: Right Arm)   Pulse 88   Temp 97.8 F (36.6 C) (Oral)   Resp 18   Ht 5\' 1"  (1.549 m)   Wt 178 lb (80.7 kg)   LMP 01/21/2018   SpO2 100%   BMI 33.63 kg/m   Visual Acuity Right Eye Distance:   Left Eye Distance:   Bilateral Distance:    Right Eye Near:   Left Eye Near:    Bilateral Near:     Physical Exam Vitals signs and nursing note reviewed.  Constitutional:      General: She is not in acute distress.    Appearance: Normal appearance. She is not ill-appearing or toxic-appearing.  HENT:     Head: Normocephalic and atraumatic.     Right Ear: Tympanic membrane, ear canal and external ear normal.     Left Ear: Tympanic membrane, ear canal and external ear normal.     Nose: Mucosal edema, congestion and rhinorrhea present.     Right Nostril: No occlusion.     Left Nostril: No occlusion.     Right Turbinates: Enlarged and swollen.     Left Turbinates: Enlarged and swollen.     Mouth/Throat:     Mouth: Mucous membranes are moist.     Pharynx:  Oropharynx is clear. No oropharyngeal exudate or posterior oropharyngeal erythema.  Eyes:     Conjunctiva/sclera: Conjunctivae normal.  Neck:     Musculoskeletal: Normal range of motion and neck supple.  Cardiovascular:     Rate and Rhythm: Normal rate and regular rhythm.  Pulmonary:     Effort: Pulmonary effort is normal.     Breath sounds: Normal breath sounds.  Musculoskeletal: Normal range of motion.  Skin:    General: Skin is warm and dry.  Neurological:     Mental Status: She is alert.  Psychiatric:        Mood and Affect: Mood normal.      UC Treatments / Results  Labs (all labs ordered are listed, but only abnormal results are displayed) Labs Reviewed - No data to display  EKG None  Radiology No results found.  Procedures Procedures (including critical care time)  Medications Ordered in UC Medications - No data to display  Initial Impression / Assessment and Plan / UC Course  I have reviewed the triage vital signs and the nursing notes.  Pertinent labs & imaging results that were available during my care of the patient were reviewed by me and considered in my medical decision making (see chart for details).     Patient is a 42 year old female presents with severe nasal congestion, thick green mucus her symptoms have worsened despite using over-the-counter Flonase and Zyrtec. We will go ahead and treat today for acute sinusitis with Augmentin She can continue the Flonase and start Zyrtec-D for nasal congestion For continued or worsening symptoms she need to follow-up with her primary care provider Final Clinical Impressions(s) / UC Diagnoses   Final diagnoses:  Acute non-recurrent sinusitis, unspecified location     Discharge Instructions     I am treating you for a sinus infection Try some zyrtec D for congestion  Continue the Flonase. Follow up as needed for continued or worsening symptoms     ED Prescriptions    Medication Sig Dispense  Auth. Provider   amoxicillin-clavulanate (AUGMENTIN) 875-125 MG tablet  Take 1 tablet by mouth every 12 (twelve) hours. 14 tablet Dahlia Byes A, NP     Controlled Substance Prescriptions [place] Controlled Substance Registry consulted? Not Applicable   Janace Aris, NP 03/04/18 [phone]

## 2018-05-13 ENCOUNTER — Ambulatory Visit
Admission: EM | Admit: 2018-05-13 | Discharge: 2018-05-13 | Disposition: A | Discharge status: Home / Self Care | Payer: 59

## 2018-05-13 ENCOUNTER — Encounter: Payer: Self-pay | Admitting: Emergency Medicine

## 2018-05-13 DIAGNOSIS — M5489 Other dorsalgia: Secondary | ICD-10-CM | POA: Diagnosis not present

## 2018-05-13 MED ORDER — DICLOFENAC POTASSIUM 50 MG PO TABS: 50.0000 mg | ORAL_TABLET | Freq: Three times a day (TID) | ORAL | 0 refills | Status: DC | PRN

## 2018-05-13 MED ORDER — CYCLOBENZAPRINE HCL 10 MG PO TABS: 10.0000 mg | ORAL_TABLET | Freq: Two times a day (BID) | ORAL | 0 refills | Status: DC | PRN

## 2018-05-13 NOTE — ED Triage Notes (Signed)
Pt presents to [facility] for assessment of right mid back pain starting Thursday morning.  Patient states she started having pain during a workout doing arm lifts with 35lb weights.  States ibuprofen at home isn't helping.

## 2018-05-13 NOTE — ED Notes (Signed)
Explained delay for room and offered water to patient.

## 2018-05-13 NOTE — Discharge Instructions (Signed)
Take medications as prescribed.  Alternate between ice/heat to affected areas.  Avoid any heavy lifting, pulling or strenuous activity for the next few days.  Follow-up with orthopedics if no improvement in your symptoms.

## 2018-05-13 NOTE — ED Provider Notes (Signed)
[facility]    CSN: [id number] Arrival date & time: 05/13/18  1330     History   Chief Complaint Chief Complaint  Patient presents with  . Back Pain    HPI Bethany Morris is a 43 y.o. female.   Subjective:  Bethany Morris is a 43 y.o. female who presents for evaluation of low back pain. The patient has had no prior back problems. Symptoms have been present for 4 days and are gradually worsening.  Onset was related to / precipitated by a remote injury (working out/excercise). The pain is located in the right thoracic region and does not radiate. The pain is described as aching and occurs all day. She rates her pain as a 7 on a scale of 0-10. Symptoms are exacerbated by flexion, lying down and twisting. Symptoms are improved by nothing. She has also tried change in body position, NSAIDs, stretching and massage which provided very little symptom relief. She has no weakness in the legs, tingling in the legs, burning pain in the legs, urinary incontinence, bowel incontinence or groin/perineal numbness associated with the back pain. The patient has no "red flag" history indicative of complicated back pain.  The following portions of the patient's history were reviewed and updated as appropriate: allergies, current medications, past family history, past medical history, past social history, past surgical history and problem list.       Past Medical History:  Diagnosis Date  . Hirsutism   . HPV (human papilloma virus) infection   . Hypertension   . Migraine   . PCOS (polycystic ovarian syndrome)     Patient Active Problem List   Diagnosis Date Noted  . Stillbirth of single fetus 01/16/2014  . Postpartum care following vaginal delivery (10/30) 01/16/2014  . IUGR (intrauterine growth restriction) 01/13/2014    Past Surgical History:  Procedure Laterality Date  . CESAREAN SECTION      OB History    Gravida  4   Para  3   Term  1   Preterm  2   AB      Living  2     SAB      TAB      Ectopic      Multiple      Live Births               Home Medications    Prior to Admission medications   Medication Sig Start Date End Date Taking? Authorizing Provider  amLODipine (NORVASC) 5 MG tablet Take 5 mg by mouth daily.   Yes [provider]  ibuprofen (ADVIL,MOTRIN) 600 MG tablet Take 1 tablet (600 mg total) by mouth every 6 (six) hours. 01/17/14  Yes Cousins, Nena Jordan, MD  telmisartan-hydrochlorothiazide (MICARDIS HCT) 80-12.5 MG tablet Take 1 tablet by mouth daily.   Yes [provider]  cyclobenzaprine (FLEXERIL) 10 MG tablet Take 1 tablet (10 mg total) by mouth 2 (two) times daily as needed for muscle spasms. 05/13/18   Lurline Idol, FNP  diclofenac (CATAFLAM) 50 MG tablet Take 1 tablet (50 mg total) by mouth 3 (three) times daily as needed (pain). 05/13/18   Lurline Idol, FNP  Prenatal Vit-Fe Fumarate-FA (PRENATAL MULTIVITAMIN) TABS tablet Take 1 tablet by mouth daily at 12 noon.    [provider]    Family History Family History  Problem Relation Age of Onset  . Hypertension Mother   . Hypertension Father     Social History Social History  Tobacco Use  . Smoking status: Never Smoker  . Smokeless tobacco: Never Used  Substance Use Topics  . Alcohol use: No  . Drug use: No     Allergies   Patient has no known allergies.   Review of Systems Review of Systems  Musculoskeletal: Positive for back pain.  All other systems reviewed and are negative.    Physical Exam Triage Vital Signs ED Triage Vitals [05/13/18 1400]  Enc Vitals Group     BP 112/63     Pulse Rate 78     Resp 18     Temp 98.2 F (36.8 C)     Temp Source Oral     SpO2 98 %     Weight      Height      Head Circumference      Peak Flow      Pain Score 7     Pain Loc      Pain Edu?      Excl. in GC?    No data found.  Updated Vital Signs BP 112/63 (BP Location: Left Arm)   Pulse 78   Temp  98.2 F (36.8 C) (Oral)   Resp 18   LMP 04/24/2018   SpO2 98%   Visual Acuity Right Eye Distance:   Left Eye Distance:   Bilateral Distance:    Right Eye Near:   Left Eye Near:    Bilateral Near:     Physical Exam Vitals signs reviewed.  Constitutional:      Appearance: Normal appearance.  HENT:     Head: Normocephalic.  Neck:     Musculoskeletal: Normal range of motion and neck supple.  Cardiovascular:     Rate and Rhythm: Normal rate and regular rhythm.  Pulmonary:     Effort: Pulmonary effort is normal.     Breath sounds: Normal breath sounds.  Musculoskeletal: Normal range of motion.     Comments: Full range of motion with discomfort. No spasm. No curvature. Normal reflexes, gait, strength and negative straight-leg raise. Normal muscle tone. Straight leg raise negative   Skin:    General: Skin is warm and dry.  Neurological:     General: No focal deficit present.     Mental Status: She is alert and oriented to person, place, and time.     Comments: normal DTRs, muscle strength and reflexes   Psychiatric:        Mood and Affect: Mood normal.        Behavior: Behavior normal.        Thought Content: Thought content normal.        Judgment: Judgment normal.      UC Treatments / Results  Labs (all labs ordered are listed, but only abnormal results are displayed) Labs Reviewed - No data to display  EKG None  Radiology No results found.  Procedures Procedures (including critical care time)  Medications Ordered in UC Medications - No data to display  Initial Impression / Assessment and Plan / UC Course  I have reviewed the triage vital signs and the nursing notes.  Pertinent labs & imaging results that were available during my care of the patient were reviewed by me and considered in my medical decision making (see chart for details).     43 year old female presenting with acute back pain that started about 4 days after working out.  She has tried  changes in body positions, Motrin, stretching and massage which is provided very little  symptom relief.  She has no red flag history indicative of complicated back pain.  Symptoms likely due to an acute muscle strain.  Supportive care advised at this time.   Plan:  Natural history and expected course discussed. Questions answered. Proper lifting, bending technique discussed. Stretching exercises discussed. Short (2-4 day) period of relative rest recommended until acute symptoms improve. Ice to affected area as needed for local pain relief. Heat to affected area as needed for local pain relief. NSAIDs per medication orders. Muscle relaxants per medication orders. Orthopedic follow-up if no improvement in symptoms   Today's evaluation has revealed no signs of a dangerous process. Discussed diagnosis with patient. Patient aware of their diagnosis, possible red flag symptoms to watch out for and need for close follow up. Patient understands verbal and written discharge instructions. Patient comfortable with plan and disposition.  Patient has a clear mental status at this time, good insight into illness (after discussion and teaching) and has clear judgment to make decisions regarding their care.  Documentation was completed with the aid of voice recognition software. Transcription may contain typographical errors. Final Clinical Impressions(s) / UC Diagnoses   Final diagnoses:  Pain in right paraspinal region     Discharge Instructions     Take medications as prescribed.  Alternate between ice/heat to affected areas.  Avoid any heavy lifting, pulling or strenuous activity for the next few days.  Follow-up with orthopedics if no improvement in your symptoms.    ED Prescriptions    Medication Sig Dispense Auth. Provider   cyclobenzaprine (FLEXERIL) 10 MG tablet Take 1 tablet (10 mg total) by mouth 2 (two) times daily as needed for muscle spasms. 20 tablet Lurline Idol, FNP    diclofenac (CATAFLAM) 50 MG tablet Take 1 tablet (50 mg total) by mouth 3 (three) times daily as needed (pain). 21 tablet Lurline Idol, FNP     Controlled Substance Prescriptions [place] Controlled Substance Registry consulted? Not Applicable   Lurline Idol, [place] 05/13/18 [phone]

## 2018-05-13 NOTE — ED Notes (Signed)
Patient able to ambulate independently  

## 2019-08-14 ENCOUNTER — Ambulatory Visit: Payer: No Typology Code available for payment source | Attending: Family

## 2019-08-14 DIAGNOSIS — Z23 Encounter for immunization: Secondary | ICD-10-CM

## 2019-08-14 NOTE — Progress Notes (Signed)
   Covid-19 Vaccination Clinic  Name:  Bethany Morris    MRN: [id number] DOB: Dec 04, 1975  08/14/2019  Ms. Bethany Morris was observed post Covid-19 immunization for 15 minutes without incident. She was provided with Vaccine Information Sheet and instruction to access the V-Safe system.   Ms. Bethany Morris was instructed to call 911 with any severe reactions post vaccine: Marland Kitchen Difficulty breathing  . Swelling of face and throat  . A fast heartbeat  . A bad rash all over body  . Dizziness and weakness   Immunizations Administered    Name Date Dose VIS Date Route   Pfizer COVID-19 Vaccine 08/14/2019  4:13 PM 0.3 mL 05/14/2018 Intramuscular   Manufacturer: [employer], [employer]   Lot: [id number]   NDC: [address]-0

## 2019-09-09 ENCOUNTER — Ambulatory Visit: Payer: No Typology Code available for payment source | Attending: Family

## 2019-09-09 DIAGNOSIS — Z23 Encounter for immunization: Secondary | ICD-10-CM

## 2019-09-09 NOTE — Progress Notes (Signed)
   Covid-19 Vaccination Clinic  Name:  Bethany Morris    MRN: [id number] DOB: [birth year]  09/09/2019  [name] was observed post Covid-19 immunization for 15 minutes without incident. She was provided with Vaccine Information Sheet and instruction to access the V-Safe system.   [name] was instructed to call 911 with any severe reactions post vaccine: Marland Kitchen Difficulty breathing  . Swelling of face and throat  . A fast heartbeat  . A bad rash all over body  . Dizziness and weakness   Immunizations Administered    Name Date Dose VIS Date Route   Pfizer COVID-19 Vaccine 09/09/2019  4:05 PM 0.3 mL 05/14/2018 Intramuscular   Manufacturer: [employer], [employer]   Lot: [id number]   NDC: [address]-8

## 2020-01-14 ENCOUNTER — Other Ambulatory Visit: Payer: Self-pay

## 2020-01-14 ENCOUNTER — Encounter: Payer: Self-pay | Admitting: Family Medicine

## 2020-01-14 ENCOUNTER — Ambulatory Visit (INDEPENDENT_AMBULATORY_CARE_PROVIDER_SITE_OTHER): Payer: 59 | Admitting: Family Medicine

## 2020-01-14 VITALS — BP 140/94 | HR 73 | Temp 98.1°F | Ht 60.83 in | Wt 179.6 lb

## 2020-01-14 DIAGNOSIS — Z0000 Encounter for general adult medical examination without abnormal findings: Secondary | ICD-10-CM | POA: Diagnosis not present

## 2020-01-14 DIAGNOSIS — Z1159 Encounter for screening for other viral diseases: Secondary | ICD-10-CM | POA: Diagnosis not present

## 2020-01-14 DIAGNOSIS — I10 Essential (primary) hypertension: Secondary | ICD-10-CM | POA: Diagnosis not present

## 2020-01-14 DIAGNOSIS — E282 Polycystic ovarian syndrome: Secondary | ICD-10-CM | POA: Insufficient documentation

## 2020-01-14 LAB — CBC WITH DIFFERENTIAL/PLATELET
Basophils Absolute: 51 cells/uL (ref 0–200)
MCHC: 31.3 g/dL — ABNORMAL LOW (ref 32.0–36.0)
Neutrophils Relative %: 41 %

## 2020-01-14 MED ORDER — TELMISARTAN-HCTZ 80-12.5 MG PO TABS: 1.0000 | ORAL_TABLET | Freq: Every day | ORAL | 3 refills | Status: DC

## 2020-01-14 MED ORDER — AMLODIPINE BESYLATE 5 MG PO TABS: 5.0000 mg | ORAL_TABLET | Freq: Every day | ORAL | 3 refills | Status: DC

## 2020-01-14 NOTE — Progress Notes (Signed)
Patient: Bethany Morris MRN: [id number] DOB: 03/04/1976 PCP: Orland Mustard, MD     Subjective:  Chief Complaint  Patient presents with  . New Patient (Initial Visit)    Previous PCP was Dr. Parke Simmers in [place]  . Hypertension  . Medication Refill    Telmisartan    HPI: The patient is a 44 y.o. female who presents today for annual exam and establishing care. She has hx of HTN and PCOS.  She denies any changes to past medical history. There have been no recent hospitalizations. They are not following a well balanced diet and exercise plan. Weight has been steady.   Family history of colon cancer in her father diagnosed age 32 years. She thinks it was primary prostate cancer, but will check for me.   Hypertension: Here for follow up of hypertension.  Currently on norvasc 5mg  daily and micardis 80-12.5mg /daily . She has been out of her micardis for one month. Home readings range from 110 systolic/70-80 diastolic. Takes medication as prescribed and denies any side effects. Exercise includes 1-2x/week. Weight has been stable. Denies any chest pain, headaches, shortness of breath, vision changes, swelling in lower extremities.   UTD on covid vaccines.   Immunization History  Administered Date(s) Administered  . PFIZER SARS-COV-2 Vaccination 08/14/2019, 09/09/2019   Colonoscopy: routine screening UNLESS father had primary colon cacner.  Mammogram: 10/2019 Pap smear: 09/2019 Tdap: unknown    Review of Systems  Constitutional: Negative for chills, fatigue and fever.  HENT: Negative for dental problem, ear pain, hearing loss and trouble swallowing.   Eyes: Negative for visual disturbance.  Respiratory: Negative for cough, chest tightness and shortness of breath.   Cardiovascular: Negative for chest pain, palpitations and leg swelling.  Gastrointestinal: Negative for abdominal pain, blood in stool, diarrhea and nausea.  Endocrine: Negative for cold intolerance, polydipsia,  polyphagia and polyuria.  Genitourinary: Negative for dysuria and hematuria.  Musculoskeletal: Negative for arthralgias.  Skin: Negative for rash.  Neurological: Negative for dizziness and headaches.  Psychiatric/Behavioral: Negative for dysphoric mood and sleep disturbance. The patient is not nervous/anxious.     Allergies Patient has No Known Allergies.  Past Medical History Patient  has a past medical history of Hirsutism, HPV (human papilloma virus) infection, Hypertension, Migraine, and PCOS (polycystic ovarian syndrome).  Surgical History Patient  has a past surgical history that includes Cesarean section.  Family History Pateint's family history includes Hypertension in her father and mother.  Social History Patient  reports that she has never smoked. She has never used smokeless tobacco. She reports current alcohol use. She reports that she does not use drugs.    Objective: Vitals:   01/14/20 0929 01/14/20 1006  BP: (!) 158/100 (!) 140/94  Pulse: 73   Temp: 98.1 F (36.7 C)   SpO2: 100%   Weight: 179 lb 9.6 oz (81.5 kg)   Height: 5' 0.83" (1.545 m)     Body mass index is 34.13 kg/m.  Physical Exam Vitals reviewed.  Constitutional:      Appearance: Normal appearance. She is well-developed. She is obese.  HENT:     Head: Normocephalic and atraumatic.     Right Ear: Ear canal and external ear normal. There is impacted cerumen.     Left Ear: Ear canal and external ear normal. There is impacted cerumen.     Nose: Nose normal.     Mouth/Throat:     Mouth: Mucous membranes are moist.  Eyes:     Extraocular Movements: Extraocular  movements intact.     Conjunctiva/sclera: Conjunctivae normal.     Pupils: Pupils are equal, round, and reactive to light.  Neck:     Thyroid: No thyromegaly.     Vascular: No carotid bruit.  Cardiovascular:     Rate and Rhythm: Normal rate and regular rhythm.     Pulses: Normal pulses.     Heart sounds: Normal heart sounds. No  murmur heard.   Pulmonary:     Effort: Pulmonary effort is normal.     Breath sounds: Normal breath sounds.  Abdominal:     General: Bowel sounds are normal. There is no distension.     Palpations: Abdomen is soft.     Tenderness: There is no abdominal tenderness.  Musculoskeletal:     Cervical back: Normal range of motion and neck supple.  Lymphadenopathy:     Cervical: No cervical adenopathy.  Skin:    General: Skin is warm and dry.     Capillary Refill: Capillary refill takes less than 2 seconds.     Findings: No rash.     Comments: hirsutism   Neurological:     General: No focal deficit present.     Mental Status: She is alert and oriented to person, place, and time.     Cranial Nerves: No cranial nerve deficit.     Coordination: Coordination normal.     Deep Tendon Reflexes: Reflexes normal.  Psychiatric:        Mood and Affect: Mood normal.        Behavior: Behavior normal.          Office Visit from 01/14/2020 in [facility]  PHQ-2 Total Score 0      Assessment/plan: 1. Annual physical exam Routine fasting labs today. Hm reviewed and UTD. Will check on colon cancer with her father. If primary needs colonoscopy now. Has had her pap/mmg and requested records. Declines flu today and tdap today. Has had covid vaccines. Encouraged exercise and weight loss. Looking into weight loss clinic. Overall doing well. F/u in one year or as needed.  Patient counseling [x]    Nutrition: Stressed importance of moderation in sodium/caffeine intake, saturated fat and cholesterol, caloric balance, sufficient intake of fresh fruits, vegetables, fiber, calcium, iron, and 1 mg of folate supplement per day (for females capable of pregnancy).  [x]    Stressed the importance of regular exercise.   []    Substance Abuse: Discussed cessation/primary prevention of tobacco, alcohol, or other drug use; driving or other dangerous activities under the influence; availability of  treatment for abuse.   [x]    Injury prevention: Discussed safety belts, safety helmets, smoke detector, smoking near bedding or upholstery.   [x]    Sexuality: Discussed sexually transmitted diseases, partner selection, use of condoms, avoidance of unintended pregnancy  and contraceptive alternatives.  [x]    Dental health: Discussed importance of regular tooth brushing, flossing, and dental visits.  [x]    Health maintenance and immunizations reviewed. Please refer to Health maintenance section.    - CBC with Differential/Platelet; Future - TSH; Future - TSH - CBC with Differential/Platelet  2. Benign essential HTN Above goal, but off one of her medications. Home readings are to goal. Re start her home micardis 80-12.5mg /day and continue norvasc 5mg /day. Refills given. routine labs today. Continue with home log. She is a and will let me know if >140/90. Encouraged more exercise/low salt diet. F/u in one year or sooner if bp not to goal.  - Lipid panel; Future -  Microalbumin / creatinine urine ratio; Future - COMPLETE METABOLIC PANEL WITH GFR; Future - COMPLETE METABOLIC PANEL WITH GFR - Microalbumin / creatinine urine ratio - Lipid panel  3. Encounter for hepatitis C screening test for low risk patient  - Hepatitis C antibody; Future - Hepatitis C antibody   This visit occurred during the SARS-CoV-2 public health emergency.  Safety protocols were in place, including screening questions prior to the visit, additional usage of staff PPE, and extensive cleaning of exam room while observing appropriate contact time as indicated for disinfecting solutions.     Return in about 1 year (around 01/13/2021) for annual/htn .     Orland Mustard, MD [facility]  01/14/2020

## 2020-01-14 NOTE — Patient Instructions (Signed)
-check with dad about colon cancer. If primary we need to colonoscopy NOW!  -refilled blood pressure medication.  -keep working on weight and make sure blood pressure is good.  You're good for a year!  So nice to meet you you're doing great!   Dr. Rogers Blocker   Preventive Care 57-42 Years Old, Female Preventive care refers to visits with your health care provider and lifestyle choices that can promote health and wellness. This includes:  A yearly physical exam. This may also be called an annual well check.  Regular dental visits and eye exams.  Immunizations.  Screening for certain conditions.  Healthy lifestyle choices, such as eating a healthy diet, getting regular exercise, not using drugs or products that contain nicotine and tobacco, and limiting alcohol use. What can I expect for my preventive care visit? Physical exam Your health care provider will check your:  Height and weight. This may be used to calculate body mass index (BMI), which tells if you are at a healthy weight.  Heart rate and blood pressure.  Skin for abnormal spots. Counseling Your health care provider may ask you questions about your:  Alcohol, tobacco, and drug use.  Emotional well-being.  Home and relationship well-being.  Sexual activity.  Eating habits.  Work and work Statistician.  Method of birth control.  Menstrual cycle.  Pregnancy history. What immunizations do I need?  Influenza (flu) vaccine  This is recommended every year. Tetanus, diphtheria, and pertussis (Tdap) vaccine  You may need a Td booster every 10 years. Varicella (chickenpox) vaccine  You may need this if you have not been vaccinated. Zoster (shingles) vaccine  You may need this after age 79. Measles, mumps, and rubella (MMR) vaccine  You may need at least one dose of MMR if you were born in 1957 or later. You may also need a second dose. Pneumococcal conjugate (PCV13) vaccine  You may need this if you have  certain conditions and were not previously vaccinated. Pneumococcal polysaccharide (PPSV23) vaccine  You may need one or two doses if you smoke cigarettes or if you have certain conditions. Meningococcal conjugate (MenACWY) vaccine  You may need this if you have certain conditions. Hepatitis A vaccine  You may need this if you have certain conditions or if you travel or work in places where you may be exposed to hepatitis A. Hepatitis B vaccine  You may need this if you have certain conditions or if you travel or work in places where you may be exposed to hepatitis B. Haemophilus influenzae type b (Hib) vaccine  You may need this if you have certain conditions. Human papillomavirus (HPV) vaccine  If recommended by your health care provider, you may need three doses over 6 months. You may receive vaccines as individual doses or as more than one vaccine together in one shot (combination vaccines). Talk with your health care provider about the risks and benefits of combination vaccines. What tests do I need? Blood tests  Lipid and cholesterol levels. These may be checked every 5 years, or more frequently if you are over 51 years old.  Hepatitis C test.  Hepatitis B test. Screening  Lung cancer screening. You may have this screening every year starting at age 35 if you have a 30-pack-year history of smoking and currently smoke or have quit within the past 15 years.  Colorectal cancer screening. All adults should have this screening starting at age 82 and continuing until age 74. Your health care provider may recommend screening  at age 6 if you are at increased risk. You will have tests every 1-10 years, depending on your results and the type of screening test.  Diabetes screening. This is done by checking your blood sugar (glucose) after you have not eaten for a while (fasting). You may have this done every 1-3 years.  Mammogram. This may be done every 1-2 years. Talk with your  health care provider about when you should start having regular mammograms. This may depend on whether you have a family history of breast cancer.  BRCA-related cancer screening. This may be done if you have a family history of breast, ovarian, tubal, or peritoneal cancers.  Pelvic exam and Pap test. This may be done every 3 years starting at age 60. Starting at age 48, this may be done every 5 years if you have a Pap test in combination with an HPV test. Other tests  Sexually transmitted disease (STD) testing.  Bone density scan. This is done to screen for osteoporosis. You may have this scan if you are at high risk for osteoporosis. Follow these instructions at home: Eating and drinking  Eat a diet that includes fresh fruits and vegetables, whole grains, lean protein, and low-fat dairy.  Take vitamin and mineral supplements as recommended by your health care provider.  Do not drink alcohol if: ? Your health care provider tells you not to drink. ? You are pregnant, may be pregnant, or are planning to become pregnant.  If you drink alcohol: ? Limit how much you have to 0-1 drink a day. ? Be aware of how much alcohol is in your drink. In the [place] one drink equals one 12 oz bottle of beer (355 mL), one 5 oz glass of wine (148 mL), or one 1 oz glass of hard liquor (44 mL). Lifestyle  Take daily care of your teeth and gums.  Stay active. Exercise for at least 30 minutes on 5 or more days each week.  Do not use any products that contain nicotine or tobacco, such as cigarettes, e-cigarettes, and chewing tobacco. If you need help quitting, ask your health care provider.  If you are sexually active, practice safe sex. Use a condom or other form of birth control (contraception) in order to prevent pregnancy and STIs (sexually transmitted infections).  If told by your health care provider, take low-dose aspirin daily starting at age 27. What's next?  Visit your health care provider once  a year for a well check visit.  Ask your health care provider how often you should have your eyes and teeth checked.  Stay up to date on all vaccines. This information is not intended to replace advice given to you by your health care provider. Make sure you discuss any questions you have with your health care provider. Document Revised: 11/15/2017 Document Reviewed: 11/15/2017 Elsevier Patient Education  2020 [employer].

## 2020-01-15 LAB — CBC WITH DIFFERENTIAL/PLATELET
Absolute Monocytes: 281 cells/uL (ref 200–950)
Basophils Relative: 1 %
Eosinophils Absolute: 173 cells/uL (ref 15–500)
Eosinophils Relative: 3.4 %
HCT: 40.9 % (ref 35.0–45.0)
Hemoglobin: 12.8 g/dL (ref 11.7–15.5)
Lymphs Abs: 2504 cells/uL (ref 850–3900)
MCH: 22.7 pg — ABNORMAL LOW (ref 27.0–33.0)
MCV: 72.6 fL — ABNORMAL LOW (ref 80.0–100.0)
MPV: 11.2 fL (ref 7.5–12.5)
Monocytes Relative: 5.5 %
Neutro Abs: 2091 cells/uL (ref 1500–7800)
Platelets: 363 10*3/uL (ref 140–400)
RBC: 5.63 10*6/uL — ABNORMAL HIGH (ref 3.80–5.10)
RDW: 15.2 % — ABNORMAL HIGH (ref 11.0–15.0)
Total Lymphocyte: 49.1 %
WBC: 5.1 10*3/uL (ref 3.8–10.8)

## 2020-01-15 LAB — MICROALBUMIN / CREATININE URINE RATIO
Creatinine, Urine: 46 mg/dL (ref 20–275)
Microalb Creat Ratio: 4 mcg/mg creat (ref <30)
Microalb, Ur: 0.2 mg/dL

## 2020-01-15 LAB — COMPLETE METABOLIC PANEL WITH GFR
AG Ratio: 1.9 (calc) (ref 1.0–2.5)
ALT: 17 U/L (ref 6–29)
AST: 15 U/L (ref 10–30)
Albumin: 4.9 g/dL (ref 3.6–5.1)
Alkaline phosphatase (APISO): 85 U/L (ref 31–125)
BUN: 12 mg/dL (ref 7–25)
CO2: 25 mmol/L (ref 20–32)
Calcium: 9.8 mg/dL (ref 8.6–10.2)
Chloride: 101 mmol/L (ref 98–110)
Creat: 0.86 mg/dL (ref 0.50–1.10)
GFR, Est African American: 97 mL/min/{1.73_m2} (ref >=60)
GFR, Est Non African American: 83 mL/min/{1.73_m2} (ref >=60)
Globulin: 2.6 g/dL (calc) (ref 1.9–3.7)
Glucose, Bld: 84 mg/dL (ref 65–99)
Potassium: 4.1 mmol/L (ref 3.5–5.3)
Sodium: 137 mmol/L (ref 135–146)
Total Bilirubin: 0.4 mg/dL (ref 0.2–1.2)
Total Protein: 7.5 g/dL (ref 6.1–8.1)

## 2020-01-15 LAB — HEPATITIS C ANTIBODY
Hepatitis C Ab: NONREACTIVE
SIGNAL TO CUT-OFF: 0.01 (ref <1.00)

## 2020-01-15 LAB — LIPID PANEL
Cholesterol: 187 mg/dL (ref <200)
HDL: 48 mg/dL — ABNORMAL LOW (ref >=50)
LDL Cholesterol (Calc): 119 mg/dL (calc) — ABNORMAL HIGH
Non-HDL Cholesterol (Calc): 139 mg/dL (calc) — ABNORMAL HIGH (ref <130)
Total CHOL/HDL Ratio: 3.9 (calc) (ref <5.0)
Triglycerides: 98 mg/dL (ref <150)

## 2020-01-15 LAB — TSH: TSH: 1.38 mIU/L

## 2020-01-20 ENCOUNTER — Encounter: Payer: Self-pay | Admitting: Family Medicine

## 2020-01-20 DIAGNOSIS — D563 Thalassemia minor: Secondary | ICD-10-CM | POA: Insufficient documentation

## 2020-08-18 ENCOUNTER — Ambulatory Visit
Admission: EM | Admit: 2020-08-18 | Discharge: 2020-08-18 | Disposition: A | Discharge status: Home / Self Care | Payer: 59 | Attending: Emergency Medicine | Admitting: Emergency Medicine

## 2020-08-18 ENCOUNTER — Other Ambulatory Visit: Payer: Self-pay

## 2020-08-18 ENCOUNTER — Encounter: Payer: Self-pay | Admitting: Emergency Medicine

## 2020-08-18 DIAGNOSIS — H6123 Impacted cerumen, bilateral: Secondary | ICD-10-CM | POA: Diagnosis not present

## 2020-08-18 DIAGNOSIS — H66002 Acute suppurative otitis media without spontaneous rupture of ear drum, left ear: Secondary | ICD-10-CM

## 2020-08-18 MED ORDER — AMOXICILLIN 875 MG PO TABS: 875.0000 mg | ORAL_TABLET | Freq: Two times a day (BID) | ORAL | 0 refills | Status: AC

## 2020-08-18 NOTE — ED Triage Notes (Signed)
Pt here for right ear pain x 2 weeks; pt sts tried at home care without relief

## 2020-08-18 NOTE — Discharge Instructions (Addendum)
Begin amoxicillin twice daily for 1 week to treat ear infection Use Debrox over-the-counter to help prevent wax buildup

## 2020-08-18 NOTE — ED Provider Notes (Signed)
[facility]    CSN: [id number] Arrival date & time: 08/18/20  1029      History   Chief Complaint Chief Complaint  Patient presents with  . Otalgia    HPI Bethany Morris is a 45 y.o. female history of hypertension, migraine, PCOS presenting today for evaluation of right ear muffled in hearing.  Tried using Q-tips and over-the-counter eardrops without relief.  HPI  Past Medical History:  Diagnosis Date  . Hirsutism   . HPV (human papilloma virus) infection   . Hypertension   . Migraine   . PCOS (polycystic ovarian syndrome)     Patient Active Problem List   Diagnosis Date Noted  . Beta thalassemia trait 01/20/2020  . Benign essential HTN 01/14/2020  . PCOS (polycystic ovarian syndrome) 01/14/2020    Past Surgical History:  Procedure Laterality Date  . CESAREAN SECTION      OB History    Gravida  4   Para  3   Term  1   Preterm  2   AB      Living  2     SAB      IAB      Ectopic      Multiple      Live Births               Home Medications    Prior to Admission medications   Medication Sig Start Date End Date Taking? Authorizing Provider  amoxicillin (AMOXIL) 875 MG tablet Take 1 tablet (875 mg total) by mouth 2 (two) times daily for 7 days. 08/18/20 08/25/20 Yes Leeloo Silverthorne C, PA-C  amLODipine (NORVASC) 5 MG tablet Take 1 tablet (5 mg total) by mouth daily. 01/14/20   Orland Mustard, MD  telmisartan-hydrochlorothiazide (MICARDIS HCT) 80-12.5 MG tablet Take 1 tablet by mouth daily. 01/14/20   Orland Mustard, MD    Family History Family History  Problem Relation Age of Onset  . Hypertension Mother   . Hypertension Father     Social History Social History   Tobacco Use  . Smoking status: Never Smoker  . Smokeless tobacco: Never Used  Substance Use Topics  . Alcohol use: Yes    Comment: Social  . Drug use: No     Allergies   Patient has no known allergies.   Review of Systems Review of Systems   Constitutional: Negative for activity change, appetite change, chills, fatigue and fever.  HENT: Positive for hearing loss. Negative for congestion, ear pain, rhinorrhea, sinus pressure, sore throat and trouble swallowing.   Eyes: Negative for discharge and redness.  Respiratory: Negative for cough, chest tightness and shortness of breath.   Cardiovascular: Negative for chest pain.  Gastrointestinal: Negative for abdominal pain, diarrhea, nausea and vomiting.  Musculoskeletal: Negative for myalgias.  Skin: Negative for rash.  Neurological: Negative for dizziness, light-headedness and headaches.     Physical Exam Triage Vital Signs ED Triage Vitals  Enc Vitals Group     BP 08/18/20 1152 122/76     Pulse Rate 08/18/20 1152 99     Resp 08/18/20 1152 18     Temp 08/18/20 1152 98.2 F (36.8 C)     Temp Source 08/18/20 1152 Oral     SpO2 08/18/20 1152 98 %     Weight --      Height --      Head Circumference --      Peak Flow --      Pain Score 08/18/20  1153 3     Pain Loc --      Pain Edu? --      Excl. in GC? --    No data found.  Updated Vital Signs BP 122/76 (BP Location: Left Arm)   Pulse 99   Temp 98.2 F (36.8 C) (Oral)   Resp 18   SpO2 98%   Visual Acuity Right Eye Distance:   Left Eye Distance:   Bilateral Distance:    Right Eye Near:   Left Eye Near:    Bilateral Near:     Physical Exam Vitals and nursing note reviewed.  Constitutional:      Appearance: She is well-developed.     Comments: No acute distress  HENT:     Head: Normocephalic and atraumatic.     Ears:     Comments: Bilateral cerumen impaction Left TM visualized after removal and appears erythematous and slightly opaque without good bony landmarks Right canal with significant improvement in cerumen impaction, appears to have thin dry piece of skin blocking visualization of TM    Nose: Nose normal.  Eyes:     Conjunctiva/sclera: Conjunctivae normal.  Cardiovascular:     Rate and  Rhythm: Normal rate.  Pulmonary:     Effort: Pulmonary effort is normal. No respiratory distress.  Abdominal:     General: There is no distension.  Musculoskeletal:        General: Normal range of motion.     Cervical back: Neck supple.  Skin:    General: Skin is warm and dry.  Neurological:     Mental Status: She is alert and oriented to person, place, and time.      UC Treatments / Results  Labs (all labs ordered are listed, but only abnormal results are displayed) Labs Reviewed - No data to display  EKG   Radiology No results found.  Procedures Procedures (including critical care time)  Medications Ordered in UC Medications - No data to display  Initial Impression / Assessment and Plan / UC Course  I have reviewed the triage vital signs and the nursing notes.  Pertinent labs & imaging results that were available during my care of the patient were reviewed by me and considered in my medical decision making (see chart for details).     Cerumen impaction removal by irrigation by nursing staff, improvement in hearing afterwards, left TM concerning for underlying otitis media.  Discussed strict return precautions. Patient verbalized understanding and is agreeable with plan.  Final Clinical Impressions(s) / UC Diagnoses   Final diagnoses:  Bilateral impacted cerumen  Non-recurrent acute suppurative otitis media of left ear without spontaneous rupture of tympanic membrane     Discharge Instructions     Begin amoxicillin twice daily for 1 week to treat ear infection Use Debrox over-the-counter to help prevent wax buildup    ED Prescriptions    Medication Sig Dispense Auth. Provider   amoxicillin (AMOXIL) 875 MG tablet Take 1 tablet (875 mg total) by mouth 2 (two) times daily for 7 days. 14 tablet Nahsir Venezia, Byron C, PA-C     PDMP not reviewed this encounter.   Sharyon Cable Beaulieu C, PA-C 08/18/20 1318

## 2020-11-01 LAB — HM MAMMOGRAPHY

## 2020-12-02 LAB — HM PAP SMEAR

## 2020-12-02 LAB — RESULTS CONSOLE HPV: CHL HPV: NEGATIVE

## 2021-01-14 ENCOUNTER — Ambulatory Visit (INDEPENDENT_AMBULATORY_CARE_PROVIDER_SITE_OTHER): Payer: 59 | Admitting: Physician Assistant

## 2021-01-14 ENCOUNTER — Other Ambulatory Visit: Payer: Self-pay

## 2021-01-14 ENCOUNTER — Encounter: Payer: Self-pay | Admitting: Physician Assistant

## 2021-01-14 ENCOUNTER — Encounter: Payer: 59 | Admitting: Family Medicine

## 2021-01-14 VITALS — BP 110/70 | HR 105 | Temp 98.7°F | Ht 61.0 in | Wt 184.4 lb

## 2021-01-14 DIAGNOSIS — Z8632 Personal history of gestational diabetes: Secondary | ICD-10-CM

## 2021-01-14 DIAGNOSIS — E282 Polycystic ovarian syndrome: Secondary | ICD-10-CM | POA: Diagnosis not present

## 2021-01-14 DIAGNOSIS — Z0000 Encounter for general adult medical examination without abnormal findings: Secondary | ICD-10-CM | POA: Diagnosis not present

## 2021-01-14 DIAGNOSIS — I10 Essential (primary) hypertension: Secondary | ICD-10-CM | POA: Diagnosis not present

## 2021-01-14 DIAGNOSIS — E669 Obesity, unspecified: Secondary | ICD-10-CM

## 2021-01-14 MED ORDER — AMLODIPINE BESYLATE 5 MG PO TABS: 5.0000 mg | ORAL_TABLET | Freq: Every day | ORAL | 3 refills | Status: DC

## 2021-01-14 MED ORDER — HYDROCHLOROTHIAZIDE 12.5 MG PO CAPS: 12.5000 mg | ORAL_CAPSULE | Freq: Every day | ORAL | 1 refills | Status: DC

## 2021-01-14 NOTE — Patient Instructions (Addendum)
It was great to see you!  Stop olmesartan-hydrochlorothizide combo medication  Continue hydrochlorothiazide 12.5 mg Continue norvasc 5 mg  If blood pressure remains >140/90, let me know and we will increase norvasc to 10 mg   May use cotton ball soaked in mineral oil into ear 10-20 min per week if having recurrent ear wax accumulation. May use dilute hydrogen peroxide (equal parts this and water) and place a few drops into ear every 2 weeks to help break down wax buildup.  Please go to the lab for blood work.   Our office will call you with your results unless you have chosen to receive results via MyChart.  If your blood work is normal we will follow-up each year for physicals and as scheduled for chronic medical problems.  If anything is abnormal we will treat accordingly and get you in for a follow-up.  Take care,  Lelon Mast

## 2021-01-14 NOTE — Progress Notes (Signed)
Subjective:    Bethany Morris is a 45 y.o. female and is here for a comprehensive physical exam.   HPI  Health Maintenance Due  Topic Date Due   Pneumococcal Vaccine 71-106 Years old (1 - PCV) Never done   MAMMOGRAM  Never done   PAP SMEAR-Modifier  Never done   COVID-19 Vaccine (3 - Booster for Pfizer series) 11/04/2019    Acute Concerns: None discussed at this time.   Chronic Issues: Hx of Gestational Diabetes At the time of her previous pregnancy, Bethany Morris was a borderline gestational diabetic. At that time she was treated by dietary changes rather than insulin intervention. Since then she has been watching her weight as well as her diet.   Hypertension Bethany Morris is currently compliant with taking miscardis HCT 12.5 mg and norvasc 5 mg . Bethany Morris recently noticed a connection between the telmisartan and sinusitis She would like to know if there is an alternative medication. Other than this she is managing well.   Patient denies chest pain, SOB, blurred vision, dizziness, unusual headaches, lower leg swelling. Denies excessive caffeine intake, stimulant usage, excessive alcohol intake, or increase in salt consumption.  BP Readings from Last 3 Encounters:  08/18/20 122/76  01/14/20 (!) 140/94  05/13/18 112/63   PCOS Initially she tried OCPs in an effort to regularize her menstrual cycle and aid in treatment of sx. This proved non helpful due to OCP causing even more irregular cycle as well as introducing heavier flows. Currently she is still experiencing hair loss and irregular cycles, but has managed to manage her sx with healthy eating. She is managing well.   Health Maintenance: Immunizations -- COVID- Last completed 09/09/19 Proofreader -2 doses) Tdap- Not completed Influenza Vaccine- Not completed Colonoscopy -- Not completed Mammogram -- UTD -- requesting records PAP -- UTD -- requesting records Bone Density -- N/A Diet -- Normal; tries to maintain a balanced diet;  increased water intake Sleep habits -- Normal Sleep schedule Exercise -- Not as much as she should due to children's schedule Weight -- Stable Mood -- Stable Weight history: Wt Readings from Last 10 Encounters:  01/14/20 179 lb 9.6 oz (81.5 kg)  03/04/18 178 lb (80.7 kg)  03/10/14 182 lb (82.6 kg)  01/13/14 182 lb (82.6 kg)  01/13/14 178 lb (80.7 kg)  12/23/13 180 lb (81.6 kg)  12/02/13 176 lb (79.8 kg)   There is no height or weight on file to calculate BMI. No LMP recorded. (Menstrual status: Irregular Periods). Alcohol use:  reports current alcohol use. Tobacco use: Never Tobacco Use: Low Risk    Smoking Tobacco Use: Never   Smokeless Tobacco Use: Never   Passive Exposure: Not on file     Depression screen [facility] 2/9 01/14/2021  Decreased Interest 0  Down, Depressed, Hopeless 0  PHQ - 2 Score 0  Altered sleeping -  Tired, decreased energy -  Change in appetite -  Feeling bad or failure about yourself  -  Trouble concentrating -  Moving slowly or fidgety/restless -  Suicidal thoughts -  PHQ-9 Score -  Difficult doing work/chores -     Other providers/specialists: Patient Care Team: Bethany Morris, [place] as PCP - General (Physician Assistant) Maxie Better, MD as Consulting Physician (Obstetrics and Gynecology)    PMHx, SurgHx, SocialHx, Medications, and Allergies were reviewed in the Visit Navigator and updated as appropriate.   Past Medical History:  Diagnosis Date   Hirsutism    HPV (human papilloma virus) infection  Hypertension    Migraine    PCOS (polycystic ovarian syndrome)      Past Surgical History:  Procedure Laterality Date   CESAREAN SECTION       Family History  Problem Relation Age of Onset   Hypertension Mother    Hypertension Father     Social History   Tobacco Use   Smoking status: Never   Smokeless tobacco: Never  Vaping Use   Vaping Use: Never used  Substance Use Topics   Alcohol use: Yes    Comment: Social    Drug use: No    Review of Systems:   Review of Systems  Constitutional:  Negative for chills, fever, malaise/fatigue and weight loss.  HENT:  Negative for hearing loss, sinus pain and sore throat.   Respiratory:  Negative for cough and hemoptysis.   Cardiovascular:  Negative for chest pain, palpitations, leg swelling and PND.  Gastrointestinal:  Negative for abdominal pain, constipation, diarrhea, heartburn, nausea and vomiting.  Genitourinary:  Negative for dysuria, frequency and urgency.  Musculoskeletal:  Negative for back pain, myalgias and neck pain.  Skin:  Negative for itching and rash.  Neurological:  Negative for dizziness, tingling, seizures and headaches.  Endo/Heme/Allergies:  Negative for polydipsia.  Psychiatric/Behavioral:  Negative for depression. The patient is not nervous/anxious.    Objective:   There were no vitals taken for this visit. There is no height or weight on file to calculate BMI.   General Appearance:    Alert, cooperative, no distress, appears stated age  Head:    Normocephalic, without obvious abnormality, atraumatic  Eyes:    PERRL, conjunctiva/corneas clear, EOM's intact, fundi    benign, both eyes  Ears:    Normal TM's and external ear canals, both ears  Nose:   Nares normal, septum midline, mucosa normal, no drainage    or sinus tenderness  Throat:   Lips, mucosa, and tongue normal; teeth and gums normal  Neck:   Supple, symmetrical, trachea midline, no adenopathy;    thyroid:  no enlargement/tenderness/nodules; no carotid   bruit or JVD  Back:     Symmetric, no curvature, ROM normal, no CVA tenderness  Lungs:     Clear to auscultation bilaterally, respirations unlabored  Chest Wall:    No tenderness or deformity   Heart:    Regular rate and rhythm, S1 and S2 normal, no murmur, rub or gallop  Breast Exam:    Deferred  Abdomen:     Soft, non-tender, bowel sounds active all four quadrants,    no masses, no organomegaly  Genitalia:     Deferred  Extremities:   Extremities normal, atraumatic, no cyanosis or edema  Pulses:   2+ and symmetric all extremities  Skin:   Skin color, texture, turgor normal, no rashes or lesions  Lymph nodes:   Cervical, supraclavicular, and axillary nodes normal  Neurologic:   CNII-XII intact, normal strength, sensation and reflexes    throughout    Assessment/Plan:   Routine physical examination Today patient counseled on age appropriate routine health concerns for screening and prevention, each reviewed and up to date or declined. Immunizations reviewed and up to date or declined. Labs ordered and reviewed. Risk factors for depression reviewed and negative. Hearing function and visual acuity are intact. ADLs screened and addressed as needed. Functional ability and level of safety reviewed and appropriate. Education, counseling and referrals performed based on assessed risks today. Patient provided with a copy of personalized plan for preventive services.  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  History of gestational diabetes Update A1c and provide recommendations accordingly.  Obesity Encouraged patient to begin exercising again  PCOS (polycystic ovarian syndrome)  Management per ob-gyn.  Patient Counseling: [x]    Nutrition: Stressed importance of moderation in sodium/caffeine intake, saturated fat and cholesterol, caloric balance, sufficient intake of fresh fruits, vegetables, fiber, calcium, iron, and 1 mg of folate supplement per day (for females capable of pregnancy).  [x]    Stressed the importance of regular exercise.   [x]    Substance Abuse:  Discussed cessation/primary prevention of tobacco, alcohol, or other drug use; driving or other dangerous activities under the influence; availability of treatment for abuse.   [x]    Injury prevention: Discussed safety belts, safety helmets, smoke detector, smoking near bedding or upholstery.   [x]    Sexuality: Discussed sexually transmitted diseases, partner selection, use of condoms, avoidance of unintended pregnancy  and contraceptive alternatives.  [x]    Dental health: Discussed importance of regular tooth brushing, flossing, and dental visits.  [x]    Health maintenance and immunizations reviewed. Please refer to Health maintenance section.   I,Havlyn C Ratchford,acting as a for , PA.,have documented all relevant documentation on the behalf of , PA,as directed by  , PA while in the presence of , .   I, , Neurosurgeon, have reviewed all documentation for this visit. The documentation on 01/14/21 for the exam, diagnosis, procedures, and orders are all accurate and complete.   Bethany Motto, PA-C [facility]

## 2021-01-15 LAB — LIPID PANEL
Cholesterol: 192 mg/dL (ref <200)
HDL: 44 mg/dL — ABNORMAL LOW (ref >=50)
LDL Cholesterol (Calc): 131 mg/dL (calc) — ABNORMAL HIGH
Non-HDL Cholesterol (Calc): 148 mg/dL (calc) — ABNORMAL HIGH (ref <130)
Total CHOL/HDL Ratio: 4.4 (calc) (ref <5.0)
Triglycerides: 78 mg/dL (ref <150)

## 2021-01-15 LAB — COMPREHENSIVE METABOLIC PANEL
AG Ratio: 1.5 (calc) (ref 1.0–2.5)
ALT: 21 U/L (ref 6–29)
AST: 16 U/L (ref 10–30)
Albumin: 4.8 g/dL (ref 3.6–5.1)
Alkaline phosphatase (APISO): 90 U/L (ref 31–125)
BUN: 13 mg/dL (ref 7–25)
CO2: 24 mmol/L (ref 20–32)
Calcium: 9.7 mg/dL (ref 8.6–10.2)
Chloride: 98 mmol/L (ref 98–110)
Creat: 0.96 mg/dL (ref 0.50–0.99)
Globulin: 3.3 g/dL (calc) (ref 1.9–3.7)
Glucose, Bld: 57 mg/dL — ABNORMAL LOW (ref 65–99)
Potassium: 4.2 mmol/L (ref 3.5–5.3)
Sodium: 136 mmol/L (ref 135–146)
Total Bilirubin: 0.7 mg/dL (ref 0.2–1.2)
Total Protein: 8.1 g/dL (ref 6.1–8.1)

## 2021-01-15 LAB — CBC WITH DIFFERENTIAL/PLATELET
Absolute Monocytes: 518 cells/uL (ref 200–950)
Basophils Absolute: 73 cells/uL (ref 0–200)
Basophils Relative: 0.9 %
Eosinophils Absolute: 162 cells/uL (ref 15–500)
Eosinophils Relative: 2 %
HCT: 41.6 % (ref 35.0–45.0)
Hemoglobin: 13.2 g/dL (ref 11.7–15.5)
Lymphs Abs: 3127 cells/uL (ref 850–3900)
MCH: 22.9 pg — ABNORMAL LOW (ref 27.0–33.0)
MCHC: 31.7 g/dL — ABNORMAL LOW (ref 32.0–36.0)
MCV: 72.1 fL — ABNORMAL LOW (ref 80.0–100.0)
MPV: 11.6 fL (ref 7.5–12.5)
Monocytes Relative: 6.4 %
Neutro Abs: 4220 cells/uL (ref 1500–7800)
Neutrophils Relative %: 52.1 %
Platelets: 407 10*3/uL — ABNORMAL HIGH (ref 140–400)
RBC: 5.77 10*6/uL — ABNORMAL HIGH (ref 3.80–5.10)
RDW: 15.1 % — ABNORMAL HIGH (ref 11.0–15.0)
Total Lymphocyte: 38.6 %
WBC: 8.1 10*3/uL (ref 3.8–10.8)

## 2021-01-15 LAB — SPECIMEN COMPROMISED

## 2021-01-15 LAB — HEMOGLOBIN A1C
Hgb A1c MFr Bld: 5.7 % of total Hgb — ABNORMAL HIGH (ref <5.7)
Mean Plasma Glucose: 117 mg/dL
eAG (mmol/L): 6.5 mmol/L

## 2021-01-27 ENCOUNTER — Encounter: Payer: Self-pay | Admitting: Physician Assistant

## 2021-01-28 ENCOUNTER — Telehealth: Payer: Self-pay

## 2021-01-28 MED ORDER — METFORMIN HCL 500 MG PO TABS: 500.0000 mg | ORAL_TABLET | Freq: Every day | ORAL | 1 refills | Status: DC

## 2021-01-28 NOTE — Telephone Encounter (Signed)
Pt called about medication. Pt stated that based on labs, Sam wants to know if she will start a medication. Nechuma would like a call back to discuss the medication. Please Advise.

## 2021-01-28 NOTE — Telephone Encounter (Signed)
Rx for Metformin 500 mg sent to pharmacy.

## 2021-01-28 NOTE — Telephone Encounter (Signed)
Spoke to pt told her last we spoke about your lab results you did not want to start Metformin. Pt said yes, but she saw her GYN since and recommended that she start for her PCOS. Told her okay I will let Lelon Mast know and send Metformin 500 mg daily to the pharmacy for you. Pt verbalized understanding.

## 2021-02-02 ENCOUNTER — Encounter: Payer: Self-pay | Admitting: Physician Assistant

## 2021-03-10 ENCOUNTER — Encounter: Payer: 59 | Admitting: Physician Assistant

## 2021-06-14 ENCOUNTER — Other Ambulatory Visit: Payer: Self-pay | Admitting: Physician Assistant

## 2021-11-11 ENCOUNTER — Other Ambulatory Visit: Payer: Self-pay | Admitting: Physician Assistant

## 2021-11-16 LAB — HM MAMMOGRAPHY

## 2021-12-13 ENCOUNTER — Other Ambulatory Visit: Payer: Self-pay | Admitting: Physician Assistant

## 2022-02-21 ENCOUNTER — Ambulatory Visit (INDEPENDENT_AMBULATORY_CARE_PROVIDER_SITE_OTHER): Payer: 59 | Admitting: Physician Assistant

## 2022-02-21 ENCOUNTER — Encounter: Payer: Self-pay | Admitting: Physician Assistant

## 2022-02-21 VITALS — BP 120/80 | HR 100 | Temp 97.3°F | Ht 60.5 in | Wt 166.0 lb

## 2022-02-21 DIAGNOSIS — E669 Obesity, unspecified: Secondary | ICD-10-CM | POA: Diagnosis not present

## 2022-02-21 DIAGNOSIS — Z0000 Encounter for general adult medical examination without abnormal findings: Secondary | ICD-10-CM

## 2022-02-21 DIAGNOSIS — Z8632 Personal history of gestational diabetes: Secondary | ICD-10-CM

## 2022-02-21 DIAGNOSIS — E282 Polycystic ovarian syndrome: Secondary | ICD-10-CM | POA: Diagnosis not present

## 2022-02-21 DIAGNOSIS — I10 Essential (primary) hypertension: Secondary | ICD-10-CM

## 2022-02-21 LAB — HEMOGLOBIN A1C: Hgb A1c MFr Bld: 5.9 % (ref 4.6–6.5)

## 2022-02-21 LAB — CBC WITH DIFFERENTIAL/PLATELET
Basophils Absolute: 0 10*3/uL (ref 0.0–0.1)
Basophils Relative: 0.5 % (ref 0.0–3.0)
Eosinophils Absolute: 0.1 10*3/uL (ref 0.0–0.7)
Eosinophils Relative: 1.9 % (ref 0.0–5.0)
HCT: 40.3 % (ref 36.0–46.0)
Hemoglobin: 12.6 g/dL (ref 12.0–15.0)
Lymphocytes Relative: 29.8 % (ref 12.0–46.0)
Lymphs Abs: 2 10*3/uL (ref 0.7–4.0)
MCHC: 31.2 g/dL (ref 30.0–36.0)
MCV: 72.3 fl — ABNORMAL LOW (ref 78.0–100.0)
Monocytes Absolute: 0.6 10*3/uL (ref 0.1–1.0)
Monocytes Relative: 8.3 % (ref 3.0–12.0)
Neutro Abs: 4 10*3/uL (ref 1.4–7.7)
Neutrophils Relative %: 59.5 % (ref 43.0–77.0)
Platelets: 332 10*3/uL (ref 150.0–400.0)
RBC: 5.57 Mil/uL — ABNORMAL HIGH (ref 3.87–5.11)
RDW: 16.8 % — ABNORMAL HIGH (ref 11.5–15.5)
WBC: 6.7 10*3/uL (ref 4.0–10.5)

## 2022-02-21 LAB — LIPID PANEL
Cholesterol: 186 mg/dL (ref 0–200)
HDL: 53 mg/dL (ref >39.00)
LDL Cholesterol: 119 mg/dL — ABNORMAL HIGH (ref 0–99)
NonHDL: 132.69
Total CHOL/HDL Ratio: 4
Triglycerides: 68 mg/dL (ref 0.0–149.0)
VLDL: 13.6 mg/dL (ref 0.0–40.0)

## 2022-02-21 LAB — COMPREHENSIVE METABOLIC PANEL
ALT: 15 U/L (ref 0–35)
AST: 13 U/L (ref 0–37)
Albumin: 4.8 g/dL (ref 3.5–5.2)
Alkaline Phosphatase: 82 U/L (ref 39–117)
BUN: 15 mg/dL (ref 6–23)
CO2: 28 mEq/L (ref 19–32)
Calcium: 9.9 mg/dL (ref 8.4–10.5)
Chloride: 100 mEq/L (ref 96–112)
Creatinine, Ser: 0.94 mg/dL (ref 0.40–1.20)
GFR: 73.89 mL/min (ref >60.00)
Glucose, Bld: 86 mg/dL (ref 70–99)
Potassium: 3.9 mEq/L (ref 3.5–5.1)
Sodium: 138 mEq/L (ref 135–145)
Total Bilirubin: 0.4 mg/dL (ref 0.2–1.2)
Total Protein: 7.7 g/dL (ref 6.0–8.3)

## 2022-02-21 MED ORDER — METFORMIN HCL ER 750 MG PO TB24: 750.0000 mg | ORAL_TABLET | Freq: Every day | ORAL | 1 refills | Status: DC

## 2022-02-21 NOTE — Progress Notes (Signed)
Subjective:    Bethany Morris is a 46 y.o. female and is here for a comprehensive physical exam.  HPI  Health Maintenance Due  Topic Date Due   DTaP/Tdap/Td (2 - Td or Tdap) 04/03/2017   MAMMOGRAM  11/01/2021    Acute Concerns: No acute concerns discussed.  Chronic Issues: Hypertension Patient reports that she is complaint with 5 mg norvasc and HCTZ 12.5 mg daily. She does not check her blood pressure at home. She denies lightheadedness or dizziness, chest pain or SOB.  PCOS Patient reports that her 500 mg Metformin causes diarrhea. She states that sometimes she does not take it when she has to go out.  Health Maintenance: Mammogram -- completed this year -- normal and UTD -- requesting records PAP -- last completed on 12/02/2020. Diet -- She reports that she maintains a fair diet. Exercise -- Patient practices regular HIIT training.  Sleep habits -- She reports no trouble sleeping. Mood -- She is in a stable mood.  UTD with dentist? - She is UTD on dental care. UTD with eye doctor? - She is UTD on vision care.  Weight history: Wt Readings from Last 10 Encounters:  02/21/22 166 lb (75.3 kg)  01/14/21 184 lb 6.1 oz (83.6 kg)  01/14/20 179 lb 9.6 oz (81.5 kg)  03/04/18 178 lb (80.7 kg)  03/10/14 182 lb (82.6 kg)  01/13/14 182 lb (82.6 kg)  01/13/14 178 lb (80.7 kg)  12/23/13 180 lb (81.6 kg)  12/02/13 176 lb (79.8 kg)   Body mass index is 31.89 kg/m. Patient's last menstrual period was 01/18/2022 (approximate).  Alcohol use:  reports current alcohol use.  Tobacco use:  Tobacco Use: Low Risk  (02/21/2022)   Patient History    Smoking Tobacco Use: Never    Smokeless Tobacco Use: Never    Passive Exposure: Not on file   Eligible for lung cancer screening? no     02/21/2022    8:51 AM  Depression screen PHQ 2/9  Decreased Interest 0  Down, Depressed, Hopeless 0  PHQ - 2 Score 0     Other providers/specialists: Patient Care Team: Jarold Motto,  [place] as PCP - General (Physician Assistant) Maxie Better, MD as Consulting Physician (Obstetrics and Gynecology)    PMHx, SurgHx, SocialHx, Medications, and Allergies were reviewed in the Visit Navigator and updated as appropriate.   Past Medical History:  Diagnosis Date   Hirsutism    HPV (human papilloma virus) infection    Hypertension    PCOS (polycystic ovarian syndrome)      Past Surgical History:  Procedure Laterality Date   CESAREAN SECTION       Family History  Problem Relation Age of Onset   Hypertension Mother    Hypertension Father    Rectal cancer Father 39   Hypertension Sister     Social History   Tobacco Use   Smoking status: Never   Smokeless tobacco: Never  Vaping Use   Vaping Use: Never used  Substance Use Topics   Alcohol use: Yes    Comment: Social -- 1-2 max   Drug use: No    Review of Systems:   Review of Systems  Constitutional:  Negative for chills, fever, malaise/fatigue and weight loss.  HENT:  Negative for hearing loss, sinus pain and sore throat.   Respiratory:  Negative for cough and hemoptysis.   Cardiovascular:  Negative for chest pain, palpitations, leg swelling and PND.  Gastrointestinal:  Negative for abdominal pain, constipation,  diarrhea, heartburn, nausea and vomiting.  Genitourinary:  Negative for dysuria, frequency and urgency.  Musculoskeletal:  Negative for back pain, myalgias and neck pain.  Skin:  Negative for itching and rash.  Endo/Heme/Allergies:  Negative for polydipsia.  Psychiatric/Behavioral:  Negative for depression. The patient is not nervous/anxious.     Objective:   BP 120/80 (BP Location: Left Arm, Patient Position: Sitting, Cuff Size: Large)   Pulse 100   Temp (!) 97.3 F (36.3 C) (Temporal)   Ht 5' 0.5" (1.537 m)   Wt 166 lb (75.3 kg)   LMP 01/18/2022 (Approximate)   SpO2 99%   BMI 31.89 kg/m  Body mass index is 31.89 kg/m.   General Appearance:    Alert, cooperative, no distress,  appears stated age  Head:    Normocephalic, without obvious abnormality, atraumatic  Eyes:    PERRL, conjunctiva/corneas clear, EOM's intact, fundi    benign, both eyes  Ears:    Normal TM's and external ear canals, both ears  Nose:   Nares normal, septum midline, mucosa normal, no drainage    or sinus tenderness  Throat:   Lips, mucosa, and tongue normal; teeth and gums normal  Neck:   Supple, symmetrical, trachea midline, no adenopathy;    thyroid:  no enlargement/tenderness/nodules; no carotid   bruit or JVD  Back:     Symmetric, no curvature, ROM normal, no CVA tenderness  Lungs:     Clear to auscultation bilaterally, respirations unlabored  Chest Wall:    No tenderness or deformity   Heart:    Regular rate and rhythm, S1 and S2 normal, no murmur, rub or gallop  Breast Exam:    Deferred  Abdomen:     Soft, non-tender, bowel sounds active all four quadrants,    no masses, no organomegaly  Genitalia:    Deferred  Extremities:   Extremities normal, atraumatic, no cyanosis or edema  Pulses:   2+ and symmetric all extremities  Skin:   Skin color, texture, turgor normal, no rashes or lesions  Lymph nodes:   Cervical, supraclavicular, and axillary nodes normal  Neurologic:   CNII-XII intact, normal strength, sensation and reflexes    throughout    Assessment/Plan:   Routine physical examination Today patient counseled on age appropriate routine health concerns for screening and prevention, each reviewed and up to date or declined. Immunizations reviewed and up to date or declined. Labs ordered and reviewed. Risk factors for depression reviewed and negative. Hearing function and visual acuity are intact. ADLs screened and addressed as needed. Functional ability and level of safety reviewed and appropriate. Education, counseling and referrals performed based on assessed risks today. Patient provided with a copy of personalized plan for preventive services.  Benign essential HTN Well  controlled Continue HCTZ 12.5 mg and amlodipine 5 mg daily Follow-up in 1 year, sooner if concerns  PCOS (polycystic ovarian syndrome) Continue metformin but will change to extended release version for hopes for better side effect profile Continue healthy lifestyle  History of gestational diabetes Update A1c  Obesity, unspecified classification, unspecified obesity type, unspecified whether serious comorbidity present Doing well with diet and exercise -- commended efforts!  I,Verona Buck,acting as a Neurosurgeon for [employer], PA.,have documented all relevant documentation on the behalf of Jarold Motto, PA,as directed by  Jarold Motto, PA while in the presence of Jarold Motto, [place].  I, Jarold Motto, [place], have reviewed all documentation for this visit. The documentation on 02/21/22 for the exam, diagnosis, procedures, and orders  are all accurate and complete.  Jarold Motto, PA-C [facility]

## 2022-02-21 NOTE — Patient Instructions (Addendum)
It was great to see you!  I'm so proud of the healthy lifestyle changes you have made! Keep up the good work!  Let's change metformin to an extended release version for better stomach tolerance!  Please go to the lab for blood work.   Our office will call you with your results unless you have chosen to receive results via MyChart.  If your blood work is normal we will follow-up each year for physicals and as scheduled for chronic medical problems.  If anything is abnormal we will treat accordingly and get you in for a follow-up.  Take care,  Lelon Mast

## 2022-03-07 ENCOUNTER — Encounter: Payer: Self-pay | Admitting: Physician Assistant

## 2022-03-18 ENCOUNTER — Other Ambulatory Visit: Payer: Self-pay | Admitting: Physician Assistant

## 2022-08-27 ENCOUNTER — Other Ambulatory Visit: Payer: Self-pay | Admitting: Physician Assistant

## 2022-11-16 LAB — HM MAMMOGRAPHY

## 2023-01-09 ENCOUNTER — Other Ambulatory Visit: Payer: Self-pay | Admitting: *Deleted

## 2023-01-09 MED ORDER — AMLODIPINE BESYLATE 5 MG PO TABS: 5.0000 mg | ORAL_TABLET | Freq: Every day | ORAL | 0 refills | Status: DC

## 2023-01-16 ENCOUNTER — Other Ambulatory Visit: Payer: Self-pay | Admitting: *Deleted

## 2023-01-16 MED ORDER — HYDROCHLOROTHIAZIDE 12.5 MG PO CAPS: 12.5000 mg | ORAL_CAPSULE | Freq: Every day | ORAL | 0 refills | Status: DC

## 2023-02-22 NOTE — Progress Notes (Signed)
Bethany Morris is a 47 y.o. female and is here for a comprehensive physical exam.  HPI Health Maintenance Due  Topic Date Due   DTaP/Tdap/Td (2 - Td or Tdap) 04/03/2017   Colonoscopy  Never done   MAMMOGRAM  11/17/2022   Acute Concerns: None.  Chronic Issues: Hypertension: Managed/Compliant with 5 mg Amlodipine daily and 12.5 mg Hydrochlorothiazide daily.  Denies excessive caffeine intake, stimulant usage, excessive alcohol intake, or increase in salt consumption.  Denies chest pain, shortness of breath, blurred vision, dizziness, unusual headaches, or lower leg swelling.   BP Readings from Last 3 Encounters:  02/23/23 130/78  02/21/22 120/80  01/14/21 110/70   Diabetes: Managed with 750 mg XR Metformin daily.  Reports she is having the same effects. Discussed injectable alternatives.  Denies hypoglycemic or hyperglycemic episodes or symptoms.  Updating A1c today. Lab Results  Component Value Date   HGBA1C 5.9 02/21/2022   Health Maintenance: Immunizations -- Tetanus is overdue -- declines today Colonoscopy -- Never done; discussed where/how to get colonoscopy Mammogram -- She reports UTD since August 2024; requesting records from [place].  PAP -- Last done 12/02/20. Results were normal. Repeat 2027. Bone Density -- N/A Diet -- Healthy overall Exercise -- Regular exercise  Sleep habits -- Good sleep quality Mood -- Stable  UTD with dentist? - Yes UTD with eye doctor? - Has not needed to  Weight history: Wt Readings from Last 10 Encounters:  02/23/23 181 lb 6.4 oz (82.3 kg)  02/21/22 166 lb (75.3 kg)  01/14/21 184 lb 6.1 oz (83.6 kg)  01/14/20 179 lb 9.6 oz (81.5 kg)  03/04/18 178 lb (80.7 kg)  03/10/14 182 lb (82.6 kg)  01/13/14 182 lb (82.6 kg)  01/13/14 178 lb (80.7 kg)  12/23/13 180 lb (81.6 kg)  12/02/13 176 lb (79.8 kg)   Body mass index is 34.28 kg/m. No LMP recorded. (Menstrual status: Irregular Periods).  Alcohol use:  reports current alcohol  use.  Tobacco use:  Tobacco Use: Low Risk  (02/23/2023)   Patient History    Smoking Tobacco Use: Never    Smokeless Tobacco Use: Never    Passive Exposure: Not on file   Eligible for lung cancer screening? no     02/23/2023    8:08 AM  Depression screen PHQ 2/9  Decreased Interest 0  Down, Depressed, Hopeless 0  PHQ - 2 Score 0  Altered sleeping 0  Tired, decreased energy 0  Change in appetite 0  Feeling bad or failure about yourself  0  Trouble concentrating 0  Moving slowly or fidgety/restless 0  Suicidal thoughts 0  PHQ-9 Score 0    Other providers/specialists: Patient Care Team: Jarold Motto, [place] as PCP - General (Physician Assistant) Maxie Better, MD as Consulting Physician (Obstetrics and Gynecology)   PMHx, SurgHx, SocialHx, Medications, and Allergies were reviewed in the Visit Navigator and updated as appropriate.   Past Medical History:  Diagnosis Date   Hirsutism    HPV (human papilloma virus) infection    Hypertension    PCOS (polycystic ovarian syndrome)     Past Surgical History:  Procedure Laterality Date   CESAREAN SECTION     Family History  Problem Relation Age of Onset   Hypertension Mother    Hypertension Father    Rectal cancer Father 73   Hypertension Sister    Social History   Tobacco Use   Smoking status: Never   Smokeless tobacco: Never  Vaping Use   Vaping status: Never  Used  Substance Use Topics   Alcohol use: Yes    Comment: Social -- 1-2 max   Drug use: No   Review of Systems:   Review of Systems  Constitutional:  Negative for chills, fever, malaise/fatigue and weight loss.  HENT:  Negative for hearing loss, sinus pain and sore throat.   Respiratory:  Negative for cough and hemoptysis.   Cardiovascular:  Negative for chest pain, palpitations, leg swelling and PND.  Gastrointestinal:  Negative for abdominal pain, constipation, diarrhea, heartburn, nausea and vomiting.  Genitourinary:  Negative for dysuria,  frequency and urgency.  Musculoskeletal:  Negative for back pain, myalgias and neck pain.  Skin:  Negative for itching and rash.  Neurological:  Negative for dizziness, tingling, seizures and headaches.  Endo/Heme/Allergies:  Negative for polydipsia.  Psychiatric/Behavioral:  Negative for depression. The patient is not nervous/anxious.      Objective:   BP 130/78   Pulse 86   Temp (!) 97.5 F (36.4 C) (Temporal)   Ht 5\' 1"  (1.549 m)   Wt 181 lb 6.4 oz (82.3 kg)   SpO2 100%   BMI 34.28 kg/m  Body mass index is 34.28 kg/m.   General Appearance:    Alert, cooperative, no distress, appears stated age  Head:    Normocephalic, without obvious abnormality, atraumatic  Eyes:    PERRL, conjunctiva/corneas clear, EOM's intact, fundi    benign, both eyes  Ears:    Normal TM's and external ear canals, both ears  Nose:   Nares normal, septum midline, mucosa normal, no drainage    or sinus tenderness  Throat:   Lips, mucosa, and tongue normal; teeth and gums normal  Neck:   Supple, symmetrical, trachea midline, no adenopathy;    thyroid:  no enlargement/tenderness/nodules; no carotid   bruit or JVD  Back:     Symmetric, no curvature, ROM normal, no CVA tenderness  Lungs:     Clear to auscultation bilaterally, respirations unlabored  Chest Wall:    No tenderness or deformity   Heart:    Regular rate and rhythm, S1 and S2 normal, no murmur, rub or gallop  Breast Exam:    Deferred  Abdomen:     Soft, non-tender, bowel sounds active all four quadrants,    no masses, no organomegaly  Genitalia:    Deferred  Extremities:   Extremities normal, atraumatic, no cyanosis or edema  Pulses:   2+ and symmetric all extremities  Skin:   Skin color, texture, turgor normal, no rashes or lesions  Lymph nodes:   Cervical, supraclavicular, and axillary nodes normal  Neurologic:   CNII-XII intact, normal strength, sensation and reflexes    throughout    Assessment/Plan:   Routine physical  examination Today patient counseled on age appropriate routine health concerns for screening and prevention, each reviewed and up to date or declined. Immunizations reviewed and up to date or declined. Labs ordered and reviewed. Risk factors for depression reviewed and negative. Hearing function and visual acuity are intact. ADLs screened and addressed as needed. Functional ability and level of safety reviewed and appropriate. Education, counseling and referrals performed based on assessed risks today. Patient provided with a copy of personalized plan for preventive services.  Benign essential HTN Normotensive Continue amlodipine 5 mg and hydrochloroTHIAZIDE 12.5 mg daily Follow-up in 12 months, sooner if concerns  PCOS (polycystic ovarian syndrome); History of gestational diabetes Update A1c Unable to tolerate metformin Consider GLP-1 -- discussed risks/benefits/side effect(s)  Follow-up based on results  Obesity, unspecified class, unspecified obesity type, unspecified whether serious comorbidity present Continue efforts at healthy lifestyle Consider GLP-1  Special screening for malignant neoplasms, colon Order colonoscopy  I,Emily Lagle,acting as a scribe for Jarold Motto, PA.,have documented all relevant documentation on the behalf of Jarold Motto, PA,as directed by  Jarold Motto, PA while in the presence of Jarold Motto, [place].  I, Jarold Motto, [place], have reviewed all documentation for this visit. The documentation on 02/23/23 for the exam, diagnosis, procedures, and orders are all accurate and complete.  Jarold Motto, PA-C  [facility]

## 2023-02-23 ENCOUNTER — Encounter: Payer: Self-pay | Admitting: Physician Assistant

## 2023-02-23 ENCOUNTER — Ambulatory Visit (INDEPENDENT_AMBULATORY_CARE_PROVIDER_SITE_OTHER): Payer: 59 | Admitting: Physician Assistant

## 2023-02-23 VITALS — BP 130/78 | HR 86 | Temp 97.5°F | Ht 61.0 in | Wt 181.4 lb

## 2023-02-23 DIAGNOSIS — E669 Obesity, unspecified: Secondary | ICD-10-CM | POA: Diagnosis not present

## 2023-02-23 DIAGNOSIS — Z8632 Personal history of gestational diabetes: Secondary | ICD-10-CM | POA: Diagnosis not present

## 2023-02-23 DIAGNOSIS — Z0000 Encounter for general adult medical examination without abnormal findings: Secondary | ICD-10-CM | POA: Diagnosis not present

## 2023-02-23 DIAGNOSIS — I10 Essential (primary) hypertension: Secondary | ICD-10-CM

## 2023-02-23 DIAGNOSIS — E282 Polycystic ovarian syndrome: Secondary | ICD-10-CM

## 2023-02-23 DIAGNOSIS — Z1211 Encounter for screening for malignant neoplasm of colon: Secondary | ICD-10-CM

## 2023-02-23 LAB — CBC WITH DIFFERENTIAL/PLATELET
Basophils Absolute: 0.1 10*3/uL (ref 0.0–0.1)
Basophils Relative: 2.2 % (ref 0.0–3.0)
Eosinophils Absolute: 0.2 10*3/uL (ref 0.0–0.7)
Eosinophils Relative: 3.7 % (ref 0.0–5.0)
HCT: 39.5 % (ref 36.0–46.0)
Hemoglobin: 12.6 g/dL (ref 12.0–15.0)
Lymphocytes Relative: 40.9 % (ref 12.0–46.0)
Lymphs Abs: 1.8 10*3/uL (ref 0.7–4.0)
MCHC: 31.9 g/dL (ref 30.0–36.0)
MCV: 71.9 fL — ABNORMAL LOW (ref 78.0–100.0)
Monocytes Absolute: 0.4 10*3/uL (ref 0.1–1.0)
Monocytes Relative: 8.3 % (ref 3.0–12.0)
Neutro Abs: 2 10*3/uL (ref 1.4–7.7)
Neutrophils Relative %: 44.9 % (ref 43.0–77.0)
Platelets: 388 10*3/uL (ref 150.0–400.0)
RBC: 5.49 Mil/uL — ABNORMAL HIGH (ref 3.87–5.11)
RDW: 16.5 % — ABNORMAL HIGH (ref 11.5–15.5)
WBC: 4.5 10*3/uL (ref 4.0–10.5)

## 2023-02-23 LAB — LIPID PANEL
Cholesterol: 193 mg/dL (ref 0–200)
HDL: 49.5 mg/dL (ref >39.00)
LDL Cholesterol: 132 mg/dL — ABNORMAL HIGH (ref 0–99)
NonHDL: 143.56
Total CHOL/HDL Ratio: 4
Triglycerides: 59 mg/dL (ref 0.0–149.0)
VLDL: 11.8 mg/dL (ref 0.0–40.0)

## 2023-02-23 LAB — COMPREHENSIVE METABOLIC PANEL
ALT: 18 U/L (ref 0–35)
AST: 18 U/L (ref 0–37)
Albumin: 4.6 g/dL (ref 3.5–5.2)
Alkaline Phosphatase: 92 U/L (ref 39–117)
BUN: 16 mg/dL (ref 6–23)
CO2: 28 meq/L (ref 19–32)
Calcium: 9.6 mg/dL (ref 8.4–10.5)
Chloride: 100 meq/L (ref 96–112)
Creatinine, Ser: 0.86 mg/dL (ref 0.40–1.20)
GFR: 81.64 mL/min (ref >60.00)
Glucose, Bld: 87 mg/dL (ref 70–99)
Potassium: 3.7 meq/L (ref 3.5–5.1)
Sodium: 137 meq/L (ref 135–145)
Total Bilirubin: 0.4 mg/dL (ref 0.2–1.2)
Total Protein: 8.1 g/dL (ref 6.0–8.3)

## 2023-02-23 LAB — HEMOGLOBIN A1C: Hgb A1c MFr Bld: 5.9 % (ref 4.6–6.5)

## 2023-02-23 NOTE — Patient Instructions (Addendum)
It was great to see you!  Colonoscopy referral placed -- they will reach out to -- may be through MyChart so please keep that in mind  Look into Zepbound [facility] for weight loss) and Wegovy (Ozempic for weight loss) to see if this covered under your insurance plan in the meantime  Please go to the lab for blood work.   Our office will call you with your results unless you have chosen to receive results via MyChart.  If your blood work is normal we will follow-up each year for physicals and as scheduled for chronic medical problems.  If anything is abnormal we will treat accordingly and get you in for a follow-up.  Take care,  Lelon Mast

## 2023-05-18 ENCOUNTER — Ambulatory Visit: Payer: Self-pay | Admitting: Physician Assistant

## 2023-05-18 NOTE — Telephone Encounter (Signed)
 Noted.

## 2023-05-18 NOTE — Telephone Encounter (Signed)
  Chief Complaint: sinus congestion Symptoms: green mucus, coughing Frequency: 3 weeks   Disposition: [] ED /[] Urgent Care (no appt availability in office) / [x] Appointment(In office/virtual)/ []  [place] [facility] [facility]/ [] Home Care/ [] Refused Recommended Disposition /[] [facility]/ []  Follow-up with PCP Additional Notes: Pt complaining of sinus congestion for 3 weeks. Pt has been doing saline spay and Claritin and has seen improvement, but it wont go away. Pt noticed this morning after clearing sinuses, the mucus was very green. Pt denies any fever or breathing difficulty. Per protocol, pt to be seen within 3 days. Pt has appt Monday @ 1400 with PCP. RN gave care advice and pt verbalized understanding.                 Copied from CRM [id number]. Topic: Clinical - Medical Advice >> May 18, 2023  8:08 AM Alona Bene A wrote: Reason for CRM: Patient called in stating she is having a sinus infection and wants to know if provider can send medication to pharmacy for her. Please contact patient with any questions at [phone]. Reason for Disposition  [1] Sinus congestion (pressure, fullness) AND [2] present > 10 days  Answer Assessment - Initial Assessment Questions 1. LOCATION: "Where does it hurt?"      No pain 3 2. ONSET: "When did the sinus pain start?"  (e.g., hours, days)      3 weks  3. SEVERITY: "How bad is the pain?"   (Scale 1-10; mild, moderate or severe)   - MILD (1-3): doesn't interfere with normal activities    - MODERATE (4-7): interferes with normal activities (e.g., work or school) or awakens from sleep   - SEVERE (8-10): excruciating pain and patient unable to do any normal activities        Zero  4. RECURRENT SYMPTOM: "Have you ever had sinus problems before?" If Yes, ask: "When was the last time?" and "What happened that time?"      na 5. NASAL CONGESTION: "Is the nose blocked?" If Yes, ask: "Can you open it or must you breathe through your mouth?"     No   6. NASAL DISCHARGE: "Do you have discharge from your nose?" If so ask, "What color?"     Yes; greenish  7. FEVER: "Do you have a fever?" If Yes, ask: "What is it, how was it measured, and when did it start?"      no 8. OTHER SYMPTOMS: "Do you have any other symptoms?" (e.g., sore throat, cough, earache, difficulty breathing)     cough  Protocols used: Sinus Pain or Congestion-A-AH

## 2023-05-21 ENCOUNTER — Encounter: Payer: Self-pay | Admitting: Physician Assistant

## 2023-05-21 ENCOUNTER — Ambulatory Visit (INDEPENDENT_AMBULATORY_CARE_PROVIDER_SITE_OTHER): Payer: 59 | Admitting: Physician Assistant

## 2023-05-21 VITALS — BP 130/86 | HR 91 | Temp 98.1°F | Ht 61.0 in | Wt 182.5 lb

## 2023-05-21 DIAGNOSIS — R0981 Nasal congestion: Secondary | ICD-10-CM

## 2023-05-21 MED ORDER — AMOXICILLIN-POT CLAVULANATE 875-125 MG PO TABS: 1.0000 | ORAL_TABLET | Freq: Two times a day (BID) | ORAL | 0 refills | Status: DC

## 2023-05-21 MED ORDER — BENZONATATE 100 MG PO CAPS: 100.0000 mg | ORAL_CAPSULE | Freq: Three times a day (TID) | ORAL | 1 refills | Status: DC | PRN

## 2023-05-21 NOTE — Progress Notes (Signed)
 Bethany Morris is a 48 y.o. female here for a new problem.  History of Present Illness:   Chief Complaint  Patient presents with   Sinus Problem    Pt c/o sinus issues x 3 weeks, having green nasal drainage in the AM has been doing nasal flushes. Denies fever or chills. Has been using Claritin D   Sinus Problem Patient complains of a sinus  that has persisted for the past 3 weeks Associated symptoms include green nasal drainage that tends to occur in the morning and  clear up throughout the day.  Also endorse coughing which typically occurs nighttime before going to bed and slight headaches.  Reports taking Claritin D, mucinex, and been doing nasal flushes  Denies any fever, chills, body aches, ear pain, SOB, sore throat , or chest pain. She does report having an adequate intake of water throughout the day.   Past Medical History:  Diagnosis Date   Hirsutism    HPV (human papilloma virus) infection    Hypertension    PCOS (polycystic ovarian syndrome)      Social History   Tobacco Use   Smoking status: Never   Smokeless tobacco: Never  Vaping Use   Vaping status: Never Used  Substance Use Topics   Alcohol use: Yes    Comment: Social -- 1-2 max   Drug use: No    Past Surgical History:  Procedure Laterality Date   CESAREAN SECTION      Family History  Problem Relation Age of Onset   Hypertension Mother    Hypertension Father    Rectal cancer Father 98   Hypertension Sister     No Known Allergies  Current Medications:   Current Outpatient Medications:    amLODipine (NORVASC) 5 MG tablet, Take 1 tablet (5 mg total) by mouth daily., Disp: 90 tablet, Rfl: 0   hydrochlorothiazide (MICROZIDE) 12.5 MG capsule, Take 1 capsule (12.5 mg total) by mouth daily., Disp: 90 capsule, Rfl: 0   Review of Systems:   Review of Systems  HENT:  Positive for congestion and sinus pain. Negative for ear pain and sore throat.   Respiratory:  Positive for cough. Negative for  shortness of breath.   Cardiovascular:  Negative for chest pain.    Vitals:   Vitals:   05/21/23 1407  BP: 130/86  Pulse: 91  Temp: 98.1 F (36.7 C)  TempSrc: Temporal  SpO2: 99%  Weight: 182 lb 8 oz (82.8 kg)  Height: 5\' 1"  (1.549 m)     Body mass index is 34.48 kg/m.  Physical Exam:   Physical Exam Vitals and nursing note reviewed.  Constitutional:      General: She is not in acute distress.    Appearance: She is well-developed. She is not ill-appearing or toxic-appearing.  HENT:     Head: Normocephalic and atraumatic.     Right Ear: Tympanic membrane, ear canal and external ear normal. Tympanic membrane is not erythematous, retracted or bulging.     Left Ear: Tympanic membrane, ear canal and external ear normal. Tympanic membrane is not erythematous, retracted or bulging.     Nose:     Right Sinus: Frontal sinus tenderness present. No maxillary sinus tenderness.     Left Sinus: Frontal sinus tenderness present. No maxillary sinus tenderness.     Mouth/Throat:     Pharynx: Uvula midline. No posterior oropharyngeal erythema.  Eyes:     General: Lids are normal.     Conjunctiva/sclera: Conjunctivae normal.  Neck:     Trachea: Trachea normal.  Cardiovascular:     Rate and Rhythm: Normal rate and regular rhythm.     Heart sounds: Normal heart sounds, S1 normal and S2 normal.  Pulmonary:     Effort: Pulmonary effort is normal.     Breath sounds: Normal breath sounds. No decreased breath sounds, wheezing, rhonchi or rales.  Lymphadenopathy:     Cervical: No cervical adenopathy.  Skin:    General: Skin is warm and dry.  Neurological:     Mental Status: She is alert.  Psychiatric:        Speech: Speech normal.        Behavior: Behavior normal. Behavior is cooperative.     Assessment and Plan:   Sinus congestion No red flags on exam.   Will initiate augmentin per orders.  Discussed taking medications as prescribed.  Reviewed return precautions including new  or worsening fever, SOB, new or worsening cough or other concerns.  Push fluids and rest.  I recommend that patient follow-up if symptoms worsen or persist despite treatment x 7-10 days, sooner if needed.     Jarold Motto, PA-C  I,Safa M Kadhim,acting as a scribe for Jarold Motto, PA.,have documented all relevant documentation on the behalf of Jarold Motto, PA,as directed by  Jarold Motto, PA while in the presence of Jarold Motto, [place].   I, Jarold Motto, [place], have reviewed all documentation for this visit. The documentation on 05/21/23 for the exam, diagnosis, procedures, and orders are all accurate and complete.

## 2023-06-07 ENCOUNTER — Other Ambulatory Visit: Payer: Self-pay | Admitting: Physician Assistant

## 2023-06-08 ENCOUNTER — Other Ambulatory Visit: Payer: Self-pay | Admitting: Physician Assistant

## 2023-11-20 LAB — HM MAMMOGRAPHY

## 2024-02-26 ENCOUNTER — Encounter: Payer: 59 | Admitting: Physician Assistant

## 2024-03-03 ENCOUNTER — Ambulatory Visit: Admitting: Physician Assistant

## 2024-03-03 ENCOUNTER — Encounter: Payer: Self-pay | Admitting: Physician Assistant

## 2024-03-03 VITALS — BP 130/86 | HR 93 | Temp 98.2°F | Ht 61.0 in | Wt 194.4 lb

## 2024-03-03 DIAGNOSIS — Z23 Encounter for immunization: Secondary | ICD-10-CM

## 2024-03-03 DIAGNOSIS — Z6836 Body mass index (BMI) 36.0-36.9, adult: Secondary | ICD-10-CM

## 2024-03-03 DIAGNOSIS — E282 Polycystic ovarian syndrome: Secondary | ICD-10-CM | POA: Diagnosis not present

## 2024-03-03 DIAGNOSIS — E785 Hyperlipidemia, unspecified: Secondary | ICD-10-CM

## 2024-03-03 DIAGNOSIS — I10 Essential (primary) hypertension: Secondary | ICD-10-CM | POA: Diagnosis not present

## 2024-03-03 DIAGNOSIS — Z1211 Encounter for screening for malignant neoplasm of colon: Secondary | ICD-10-CM

## 2024-03-03 DIAGNOSIS — E669 Obesity, unspecified: Secondary | ICD-10-CM

## 2024-03-03 DIAGNOSIS — Z0000 Encounter for general adult medical examination without abnormal findings: Secondary | ICD-10-CM

## 2024-03-03 LAB — CBC WITH DIFFERENTIAL/PLATELET
Basophils Absolute: 0 K/uL (ref 0.0–0.1)
Basophils Relative: 0.6 % (ref 0.0–3.0)
Eosinophils Absolute: 0.2 K/uL (ref 0.0–0.7)
Eosinophils Relative: 3.3 % (ref 0.0–5.0)
HCT: 36 % (ref 36.0–46.0)
Hemoglobin: 11.5 g/dL — ABNORMAL LOW (ref 12.0–15.0)
Lymphocytes Relative: 32.4 % (ref 12.0–46.0)
Lymphs Abs: 2.2 K/uL (ref 0.7–4.0)
MCHC: 31.9 g/dL (ref 30.0–36.0)
MCV: 67 fl — ABNORMAL LOW (ref 78.0–100.0)
Monocytes Absolute: 0.5 K/uL (ref 0.1–1.0)
Monocytes Relative: 8.2 % (ref 3.0–12.0)
Neutro Abs: 3.7 K/uL (ref 1.4–7.7)
Neutrophils Relative %: 55.5 % (ref 43.0–77.0)
Platelets: 365 K/uL (ref 150.0–400.0)
RBC: 5.38 Mil/uL — ABNORMAL HIGH (ref 3.87–5.11)
RDW: 18.1 % — ABNORMAL HIGH (ref 11.5–15.5)
WBC: 6.7 K/uL (ref 4.0–10.5)

## 2024-03-03 LAB — COMPREHENSIVE METABOLIC PANEL WITH GFR
ALT: 22 U/L (ref 0–35)
AST: 19 U/L (ref 0–37)
Albumin: 4.5 g/dL (ref 3.5–5.2)
Alkaline Phosphatase: 73 U/L (ref 39–117)
BUN: 13 mg/dL (ref 6–23)
CO2: 26 meq/L (ref 19–32)
Calcium: 9.8 mg/dL (ref 8.4–10.5)
Chloride: 101 meq/L (ref 96–112)
Creatinine, Ser: 0.84 mg/dL (ref 0.40–1.20)
GFR: 83.37 mL/min (ref >60.00)
Glucose, Bld: 99 mg/dL (ref 70–99)
Potassium: 3.8 meq/L (ref 3.5–5.1)
Sodium: 136 meq/L (ref 135–145)
Total Bilirubin: 0.2 mg/dL (ref 0.2–1.2)
Total Protein: 7.9 g/dL (ref 6.0–8.3)

## 2024-03-03 LAB — HEMOGLOBIN A1C: Hgb A1c MFr Bld: 5.9 % (ref 4.6–6.5)

## 2024-03-03 MED ORDER — WEGOVY 0.25 MG/0.5ML ~~LOC~~ SOAJ: 0.2500 mg | SUBCUTANEOUS | 1 refills | Status: AC

## 2024-03-03 MED ORDER — HYDROCHLOROTHIAZIDE 12.5 MG PO CAPS: 12.5000 mg | ORAL_CAPSULE | Freq: Every day | ORAL | 3 refills | Status: AC

## 2024-03-03 MED ORDER — AMLODIPINE BESYLATE 5 MG PO TABS: 5.0000 mg | ORAL_TABLET | Freq: Every day | ORAL | 3 refills | Status: AC

## 2024-03-03 NOTE — Patient Instructions (Addendum)
 It was great to see you!  [facility] [phone]   Please go to the lab for blood work.   Our office will call you with your results unless you have chosen to receive results via MyChart.  If your blood work is normal we will follow-up each year for physicals and as scheduled for chronic medical problems.  If anything is abnormal we will treat accordingly and get you in for a follow-up.  Take care,  Cornesha Radziewicz

## 2024-03-03 NOTE — Progress Notes (Signed)
 Subjective:    Bethany Morris is a 48 y.o. female and is here for a comprehensive physical exam.  HPI  Health Maintenance Due  Topic Date Due   DTaP/Tdap/Td (2 - Td or Tdap) 04/03/2017   Colonoscopy  Never done   Discussed the use of AI scribe software for clinical note transcription with the patient, who gave verbal consent to proceed.  History of Present Illness   Bethany Morris is a 48 year old female who presents for a follow-up regarding weight management and medication options.  Her weight has fluctuated between 180 and 190 pounds, with a recent weight of 186 pounds. She notes increased weight around her menstrual cycle and attributes recent gain to limited time for exercise due to family responsibilities.  She previously tried metformin  for weight management and stopped it because of side effects. She is interested in Wegovy  for weight loss. Her A1c has been borderline, with a peak of 5.9 but never in the diabetic range. She does not check blood pressure at home, though she reports prior readings typically in the 120 to 130 mmHg range.  She is not doing structured exercise but stays active at home with regular walking and stair climbing. She has a more muscular build. She drinks alcohol socially, one to two drinks. She recently started a multivitamin and vitamin D3.       Health Maintenance: Immunizations -- getting Tetanus, Diphtheria, and Pertussis (Tdap) today Colonoscopy -- overdue Mammogram -- UpToDate  PAP -- UpToDate  Bone Density -- n/a Diet -- often eats on the go due to daughters travel ball -- tries to choose healthy foods as able Exercise -- limited time, walks regularly and does stairs  Sleep habits -- no major concerns Mood -- stable  UTD with dentist? - yes UTD with eye doctor? - yes  Weight history: Wt Readings from Last 10 Encounters:  03/03/24 194 lb 6.1 oz (88.2 kg)  05/21/23 182 lb 8 oz (82.8 kg)  02/23/23 181 lb 6.4 oz (82.3 kg)  02/21/22  166 lb (75.3 kg)  01/14/21 184 lb 6.1 oz (83.6 kg)  01/14/20 179 lb 9.6 oz (81.5 kg)  03/04/18 178 lb (80.7 kg)  03/10/14 182 lb (82.6 kg)  01/13/14 182 lb (82.6 kg)  01/13/14 178 lb (80.7 kg)   Body mass index is 36.73 kg/m. Patient's last menstrual period was 02/09/2024 (approximate).  Alcohol use:  reports current alcohol use.  Tobacco use:  Tobacco Use: Low Risk (05/21/2023)   Patient History    Smoking Tobacco Use: Never    Smokeless Tobacco Use: Never    Passive Exposure: Not on file   Eligible for lung cancer screening? no     03/03/2024    8:03 AM  Depression screen PHQ 2/9  Decreased Interest 0  Down, Depressed, Hopeless 0  PHQ - 2 Score 0     Other providers/specialists: Patient Care Team: Job Lukes, [place] as PCP - General (Physician Assistant) Rutherford Gain, MD as Consulting Physician (Obstetrics and Gynecology)    PMHx, SurgHx, SocialHx, Medications, and Allergies were reviewed in the Visit Navigator and updated as appropriate.   Past Medical History:  Diagnosis Date   Hirsutism    HPV (human papilloma virus) infection    Hypertension    PCOS (polycystic ovarian syndrome)      Past Surgical History:  Procedure Laterality Date   CESAREAN SECTION       Family History  Problem Relation Age of Onset  Hypertension Mother    Hypertension Father    Rectal cancer Father 14   Hypertension Sister    Breast cancer Neg Hx     Social History[1]  Review of Systems:   Review of Systems  Constitutional:  Negative for chills, fever, malaise/fatigue and weight loss.  HENT:  Negative for hearing loss, sinus pain and sore throat.   Respiratory:  Negative for cough and hemoptysis.   Cardiovascular:  Negative for chest pain, palpitations, leg swelling and PND.  Gastrointestinal:  Negative for abdominal pain, constipation, diarrhea, heartburn, nausea and vomiting.  Genitourinary:  Negative for dysuria, frequency and urgency.  Musculoskeletal:   Negative for back pain, myalgias and neck pain.  Skin:  Negative for itching and rash.  Neurological:  Negative for dizziness, tingling, seizures and headaches.  Endo/Heme/Allergies:  Negative for polydipsia.  Psychiatric/Behavioral:  Negative for depression. The patient is not nervous/anxious.     Objective:   BP 130/86 (BP Location: Left Arm, Patient Position: Sitting, Cuff Size: Large)   Pulse 93   Temp 98.2 F (36.8 C) (Temporal)   Ht 5' 1 (1.549 m)   Wt 194 lb 6.1 oz (88.2 kg)   LMP 02/09/2024 (Approximate)   SpO2 99%   BMI 36.73 kg/m  Body mass index is 36.73 kg/m.   General Appearance:    Alert, cooperative, no distress, appears stated age  Head:    Normocephalic, without obvious abnormality, atraumatic  Eyes:    PERRL, conjunctiva/corneas clear, EOM's intact, fundi    benign, both eyes  Ears:    Normal TM's and external ear canals, both ears  Nose:   Nares normal, septum midline, mucosa normal, no drainage    or sinus tenderness  Throat:   Lips, mucosa, and tongue normal; teeth and gums normal  Neck:   Supple, symmetrical, trachea midline, no adenopathy;    thyroid:  no enlargement/tenderness/nodules; no carotid   bruit or JVD  Back:     Symmetric, no curvature, ROM normal, no CVA tenderness  Lungs:     Clear to auscultation bilaterally, respirations unlabored  Chest Wall:    No tenderness or deformity   Heart:    Regular rate and rhythm, S1 and S2 normal, no murmur, rub or gallop  Breast Exam:    Deferred  Abdomen:     Soft, non-tender, bowel sounds active all four quadrants,    no masses, no organomegaly  Genitalia:    Deferred  Extremities:   Extremities normal, atraumatic, no cyanosis or edema  Pulses:   2+ and symmetric all extremities  Skin:   Skin color, texture, turgor normal, no rashes or lesions  Lymph nodes:   Cervical, supraclavicular, and axillary nodes normal  Neurologic:   CNII-XII intact, normal strength, sensation and reflexes    throughout     Assessment/Plan:   Assessment and Plan    General Health Maintenance Discussed colonoscopy due to family history of colon cancer. Mammogram and Pap smear up to date. Discussed calcium  and vitamin D for bone health. - Provided information for [facility] for colonoscopy scheduling. - Continue calcium  and vitamin D supplementation.     Obesity Weight fluctuating between 180-190 lbs. Previous metformin  trial not tolerated. Discussed Wegovy  for weight loss, better tolerated than metformin . Emphasized physical activity to prevent rapid weight and muscle mass loss. Wegovy  cost $199/month self-pay, oral options forthcoming. Insurance may require diabetes or hypertension diagnosis. - Sent prescription for Wegovy  to check insurance coverage. - Checked A1c level. - Encouraged incorporation  of physical activity.  Essential hypertension Blood pressure 120s-130s. No home monitoring. Discussed potential insurance coverage for Wegovy  with hypertension diagnosis. Continue amlodipine  5 mg + hydrochloroTHIAZIDE  12.5 mg daily  Hyperlipidemia Cholesterol levels checked recently. Seen in [facility] portal  PCOS Update Hemoglobin A1c and provide recommendations    Lucie Buttner, PA-C [facility]        [1]  Social History Tobacco Use   Smoking status: Never   Smokeless tobacco: Never  Vaping Use   Vaping status: Never Used  Substance Use Topics   Alcohol use: Yes    Comment: Social -- 1-2 max   Drug use: No

## 2024-03-04 ENCOUNTER — Ambulatory Visit: Payer: Self-pay | Admitting: Physician Assistant

## 2024-04-11 ENCOUNTER — Encounter: Admitting: Physician Assistant

## 2025-03-04 ENCOUNTER — Encounter: Admitting: Physician Assistant
